# Patient Record
Sex: Female | Born: 1955 | Race: White | Hispanic: No | Marital: Married | Smoking: Former smoker
Health system: Southern US, Community
[De-identification: ages and names within clinical notes are randomized; demographics above are authoritative.]

## PROBLEM LIST (undated history)

## (undated) DIAGNOSIS — R3989 Other symptoms and signs involving the genitourinary system: Secondary | ICD-10-CM

## (undated) DIAGNOSIS — D0512 Intraductal carcinoma in situ of left breast: Secondary | ICD-10-CM

## (undated) DIAGNOSIS — Z923 Personal history of irradiation: Secondary | ICD-10-CM

## (undated) DIAGNOSIS — Z9989 Dependence on other enabling machines and devices: Secondary | ICD-10-CM

## (undated) DIAGNOSIS — G4733 Obstructive sleep apnea (adult) (pediatric): Secondary | ICD-10-CM

## (undated) DIAGNOSIS — Z973 Presence of spectacles and contact lenses: Secondary | ICD-10-CM

## (undated) DIAGNOSIS — R000 Tachycardia, unspecified: Secondary | ICD-10-CM

## (undated) DIAGNOSIS — G1221 Amyotrophic lateral sclerosis: Secondary | ICD-10-CM

## (undated) DIAGNOSIS — N201 Calculus of ureter: Secondary | ICD-10-CM

## (undated) DIAGNOSIS — Z87440 Personal history of urinary (tract) infections: Secondary | ICD-10-CM

## (undated) DIAGNOSIS — N200 Calculus of kidney: Secondary | ICD-10-CM

## (undated) HISTORY — PX: BREAST LUMPECTOMY: SHX2

---

## 2001-07-18 HISTORY — PX: LAMINECTOMY: SHX219

## 2014-07-18 DIAGNOSIS — Z923 Personal history of irradiation: Secondary | ICD-10-CM

## 2014-07-18 HISTORY — DX: Personal history of irradiation: Z92.3

## 2016-08-18 HISTORY — PX: PORTA CATH INSERTION: CATH118285

## 2017-03-06 ENCOUNTER — Emergency Department (HOSPITAL_COMMUNITY): Payer: BLUE CROSS/BLUE SHIELD

## 2017-03-06 ENCOUNTER — Observation Stay (HOSPITAL_COMMUNITY): Payer: BLUE CROSS/BLUE SHIELD

## 2017-03-06 ENCOUNTER — Inpatient Hospital Stay (HOSPITAL_COMMUNITY)
Admission: EM | Admit: 2017-03-06 | Discharge: 2017-03-08 | DRG: 872 | Disposition: A | Discharge status: Home / Self Care | Payer: BLUE CROSS/BLUE SHIELD | Attending: Family Medicine | Admitting: Family Medicine

## 2017-03-06 ENCOUNTER — Encounter (HOSPITAL_COMMUNITY)
Admission: EM | Disposition: A | Discharge status: Home / Self Care | Payer: Self-pay | Source: Home / Self Care | Attending: Internal Medicine

## 2017-03-06 ENCOUNTER — Observation Stay (HOSPITAL_COMMUNITY): Payer: BLUE CROSS/BLUE SHIELD | Admitting: Anesthesiology

## 2017-03-06 ENCOUNTER — Encounter (HOSPITAL_COMMUNITY): Payer: Self-pay | Admitting: Emergency Medicine

## 2017-03-06 DIAGNOSIS — E872 Acidosis: Secondary | ICD-10-CM | POA: Diagnosis present

## 2017-03-06 DIAGNOSIS — F329 Major depressive disorder, single episode, unspecified: Secondary | ICD-10-CM | POA: Diagnosis present

## 2017-03-06 DIAGNOSIS — Z87440 Personal history of urinary (tract) infections: Secondary | ICD-10-CM

## 2017-03-06 DIAGNOSIS — Z87442 Personal history of urinary calculi: Secondary | ICD-10-CM

## 2017-03-06 DIAGNOSIS — G1221 Amyotrophic lateral sclerosis: Secondary | ICD-10-CM | POA: Diagnosis present

## 2017-03-06 DIAGNOSIS — N10 Acute tubulo-interstitial nephritis: Secondary | ICD-10-CM | POA: Diagnosis not present

## 2017-03-06 DIAGNOSIS — R000 Tachycardia, unspecified: Secondary | ICD-10-CM | POA: Diagnosis present

## 2017-03-06 DIAGNOSIS — Z87448 Personal history of other diseases of urinary system: Secondary | ICD-10-CM

## 2017-03-06 DIAGNOSIS — R9431 Abnormal electrocardiogram [ECG] [EKG]: Secondary | ICD-10-CM | POA: Diagnosis not present

## 2017-03-06 DIAGNOSIS — Z853 Personal history of malignant neoplasm of breast: Secondary | ICD-10-CM

## 2017-03-06 DIAGNOSIS — A419 Sepsis, unspecified organism: Principal | ICD-10-CM | POA: Diagnosis present

## 2017-03-06 DIAGNOSIS — R197 Diarrhea, unspecified: Secondary | ICD-10-CM | POA: Diagnosis not present

## 2017-03-06 DIAGNOSIS — Z923 Personal history of irradiation: Secondary | ICD-10-CM

## 2017-03-06 DIAGNOSIS — Z833 Family history of diabetes mellitus: Secondary | ICD-10-CM

## 2017-03-06 DIAGNOSIS — N1330 Unspecified hydronephrosis: Secondary | ICD-10-CM | POA: Diagnosis not present

## 2017-03-06 DIAGNOSIS — N132 Hydronephrosis with renal and ureteral calculous obstruction: Secondary | ICD-10-CM | POA: Diagnosis not present

## 2017-03-06 DIAGNOSIS — N201 Calculus of ureter: Secondary | ICD-10-CM

## 2017-03-06 DIAGNOSIS — N136 Pyonephrosis: Secondary | ICD-10-CM | POA: Diagnosis present

## 2017-03-06 DIAGNOSIS — N390 Urinary tract infection, site not specified: Secondary | ICD-10-CM | POA: Diagnosis not present

## 2017-03-06 DIAGNOSIS — N12 Tubulo-interstitial nephritis, not specified as acute or chronic: Secondary | ICD-10-CM | POA: Diagnosis not present

## 2017-03-06 DIAGNOSIS — Z8249 Family history of ischemic heart disease and other diseases of the circulatory system: Secondary | ICD-10-CM

## 2017-03-06 DIAGNOSIS — N202 Calculus of kidney with calculus of ureter: Secondary | ICD-10-CM | POA: Diagnosis present

## 2017-03-06 DIAGNOSIS — N200 Calculus of kidney: Secondary | ICD-10-CM | POA: Diagnosis not present

## 2017-03-06 HISTORY — DX: Personal history of urinary (tract) infections: Z87.440

## 2017-03-06 HISTORY — DX: Amyotrophic lateral sclerosis: G12.21

## 2017-03-06 HISTORY — PX: CYSTOSCOPY WITH STENT PLACEMENT: SHX5790

## 2017-03-06 HISTORY — DX: Personal history of other diseases of urinary system: Z87.448

## 2017-03-06 LAB — CBC
HCT: 39 % (ref 36.0–46.0)
Hemoglobin: 13 g/dL (ref 12.0–15.0)
MCH: 29.5 pg (ref 26.0–34.0)
MCHC: 33.3 g/dL (ref 30.0–36.0)
MCV: 88.4 fL (ref 78.0–100.0)
PLATELETS: 160 10*3/uL (ref 150–400)
RBC: 4.41 MIL/uL (ref 3.87–5.11)
RDW: 14 % (ref 11.5–15.5)
WBC: 17.1 10*3/uL — AB (ref 4.0–10.5)

## 2017-03-06 LAB — URINALYSIS, ROUTINE W REFLEX MICROSCOPIC
Bilirubin Urine: NEGATIVE
GLUCOSE, UA: NEGATIVE mg/dL
KETONES UR: 80 mg/dL — AB
Nitrite: NEGATIVE
PH: 6 (ref 5.0–8.0)
Protein, ur: 100 mg/dL — AB
SPECIFIC GRAVITY, URINE: 1.015 (ref 1.005–1.030)

## 2017-03-06 LAB — CBC WITH DIFFERENTIAL/PLATELET
Basophils Absolute: 0 10*3/uL (ref 0.0–0.1)
Basophils Relative: 0 %
EOS ABS: 0 10*3/uL (ref 0.0–0.7)
EOS PCT: 0 %
HCT: 39.3 % (ref 36.0–46.0)
Hemoglobin: 13.3 g/dL (ref 12.0–15.0)
LYMPHS ABS: 0.6 10*3/uL — AB (ref 0.7–4.0)
LYMPHS PCT: 3 %
MCH: 29.5 pg (ref 26.0–34.0)
MCHC: 33.8 g/dL (ref 30.0–36.0)
MCV: 87.1 fL (ref 78.0–100.0)
MONO ABS: 1 10*3/uL (ref 0.1–1.0)
MONOS PCT: 6 %
Neutro Abs: 16.3 10*3/uL — ABNORMAL HIGH (ref 1.7–7.7)
Neutrophils Relative %: 91 %
PLATELETS: 163 10*3/uL (ref 150–400)
RBC: 4.51 MIL/uL (ref 3.87–5.11)
RDW: 14 % (ref 11.5–15.5)
WBC: 17.9 10*3/uL — AB (ref 4.0–10.5)

## 2017-03-06 LAB — CREATININE, SERUM
CREATININE: 0.52 mg/dL (ref 0.44–1.00)
GFR calc non Af Amer: >60 mL/min (ref >60)

## 2017-03-06 LAB — LACTIC ACID, PLASMA
LACTIC ACID, VENOUS: 2.4 mmol/L — AB (ref 0.5–1.9)
Lactic Acid, Venous: 3.1 mmol/L (ref 0.5–1.9)

## 2017-03-06 LAB — COMPREHENSIVE METABOLIC PANEL
ALBUMIN: 3.7 g/dL (ref 3.5–5.0)
ALT: 64 U/L — AB (ref 14–54)
AST: 49 U/L — AB (ref 15–41)
Alkaline Phosphatase: 71 U/L (ref 38–126)
Anion gap: 9 (ref 5–15)
BUN: 9 mg/dL (ref 6–20)
CHLORIDE: 104 mmol/L (ref 101–111)
CO2: 24 mmol/L (ref 22–32)
CREATININE: 0.52 mg/dL (ref 0.44–1.00)
Calcium: 9.4 mg/dL (ref 8.9–10.3)
GFR calc Af Amer: >60 mL/min (ref >60)
GLUCOSE: 91 mg/dL (ref 65–99)
Potassium: 3.6 mmol/L (ref 3.5–5.1)
Sodium: 137 mmol/L (ref 135–145)
Total Bilirubin: 0.8 mg/dL (ref 0.3–1.2)
Total Protein: 7.1 g/dL (ref 6.5–8.1)

## 2017-03-06 LAB — I-STAT CG4 LACTIC ACID, ED
LACTIC ACID, VENOUS: 1.12 mmol/L (ref 0.5–1.9)
Lactic Acid, Venous: 0.9 mmol/L (ref 0.5–1.9)

## 2017-03-06 LAB — PROCALCITONIN: Procalcitonin: 0.23 ng/mL

## 2017-03-06 LAB — TSH: TSH: 0.224 u[IU]/mL — AB (ref 0.350–4.500)

## 2017-03-06 SURGERY — CYSTOSCOPY, WITH STENT INSERTION
Anesthesia: General | Site: Urethra | Laterality: Right

## 2017-03-06 MED ORDER — DEXAMETHASONE SODIUM PHOSPHATE 10 MG/ML IJ SOLN
INTRAMUSCULAR | Status: AC
  Filled 2017-03-06: qty 1

## 2017-03-06 MED ORDER — ONDANSETRON HCL 4 MG/2ML IJ SOLN: 4.0000 mg | Freq: Once | INTRAMUSCULAR | Status: DC | PRN

## 2017-03-06 MED ORDER — IOHEXOL 300 MG/ML  SOLN
INTRAMUSCULAR | Status: DC | PRN
  Administered 2017-03-06: 8 mL via INTRAVENOUS

## 2017-03-06 MED ORDER — SUCCINYLCHOLINE CHLORIDE 200 MG/10ML IV SOSY
PREFILLED_SYRINGE | INTRAVENOUS | Status: AC
  Filled 2017-03-06: qty 10

## 2017-03-06 MED ORDER — MEPERIDINE HCL 50 MG/ML IJ SOLN: 6.2500 mg | INTRAMUSCULAR | Status: DC | PRN

## 2017-03-06 MED ORDER — MIDAZOLAM HCL 2 MG/2ML IJ SOLN
INTRAMUSCULAR | Status: AC
  Filled 2017-03-06: qty 2

## 2017-03-06 MED ORDER — SODIUM CHLORIDE 0.9 % IV SOLN
INTRAVENOUS | Status: DC
  Administered 2017-03-06: 20:00:00 via INTRAVENOUS

## 2017-03-06 MED ORDER — LACTATED RINGERS IV SOLN: INTRAVENOUS | Status: DC | PRN

## 2017-03-06 MED ORDER — LIDOCAINE 2% (20 MG/ML) 5 ML SYRINGE
INTRAMUSCULAR | Status: AC
  Filled 2017-03-06: qty 5

## 2017-03-06 MED ORDER — FENTANYL CITRATE (PF) 100 MCG/2ML IJ SOLN: 25.0000 ug | INTRAMUSCULAR | Status: DC | PRN

## 2017-03-06 MED ORDER — STERILE WATER FOR IRRIGATION IR SOLN
Status: DC | PRN
  Administered 2017-03-06: 3000 mL

## 2017-03-06 MED ORDER — EDARAVONE 30 MG/100ML IV SOLN: 30.0000 mg | Freq: Every day | INTRAVENOUS | Status: DC

## 2017-03-06 MED ORDER — DEXTROSE 5 % IV SOLN
2.0000 g | Freq: Once | INTRAVENOUS | Status: AC
  Administered 2017-03-06: 2 g via INTRAVENOUS
  Filled 2017-03-06: qty 2

## 2017-03-06 MED ORDER — BISACODYL 10 MG RE SUPP
10.0000 mg | Freq: Every day | RECTAL | Status: DC | PRN
  Administered 2017-03-07: 10 mg via RECTAL
  Filled 2017-03-06: qty 1

## 2017-03-06 MED ORDER — SODIUM CHLORIDE 0.9 % IV BOLUS (SEPSIS)
500.0000 mL | Freq: Once | INTRAVENOUS | Status: AC
  Administered 2017-03-06: 500 mL via INTRAVENOUS

## 2017-03-06 MED ORDER — ACETAMINOPHEN 500 MG PO TABS
1000.0000 mg | ORAL_TABLET | Freq: Once | ORAL | Status: AC
  Administered 2017-03-06: 1000 mg via ORAL
  Filled 2017-03-06: qty 2

## 2017-03-06 MED ORDER — ACETAMINOPHEN 10 MG/ML IV SOLN
1000.0000 mg | Freq: Once | INTRAVENOUS | Status: AC
  Administered 2017-03-06: 1000 mg via INTRAVENOUS

## 2017-03-06 MED ORDER — DEXTROSE 5 % IV SOLN
1.0000 g | Freq: Once | INTRAVENOUS | Status: AC
  Administered 2017-03-06: 1 g via INTRAVENOUS
  Filled 2017-03-06: qty 10

## 2017-03-06 MED ORDER — PROPOFOL 10 MG/ML IV BOLUS
INTRAVENOUS | Status: AC
  Filled 2017-03-06: qty 20

## 2017-03-06 MED ORDER — ZOLPIDEM TARTRATE 5 MG PO TABS
5.0000 mg | ORAL_TABLET | Freq: Every evening | ORAL | Status: DC | PRN
  Filled 2017-03-06: qty 1

## 2017-03-06 MED ORDER — VENLAFAXINE HCL ER 150 MG PO CP24
150.0000 mg | ORAL_CAPSULE | Freq: Every day | ORAL | Status: DC
  Administered 2017-03-06 – 2017-03-08 (×3): 150 mg via ORAL
  Filled 2017-03-06 (×3): qty 1

## 2017-03-06 MED ORDER — SODIUM CHLORIDE 0.9 % IV BOLUS (SEPSIS)
1000.0000 mL | Freq: Once | INTRAVENOUS | Status: AC
  Administered 2017-03-06: 1000 mL via INTRAVENOUS

## 2017-03-06 MED ORDER — TAMOXIFEN CITRATE 10 MG PO TABS
20.0000 mg | ORAL_TABLET | Freq: Every day | ORAL | Status: DC
  Administered 2017-03-07: 20 mg via ORAL
  Filled 2017-03-06 (×3): qty 2

## 2017-03-06 MED ORDER — DEXAMETHASONE SODIUM PHOSPHATE 10 MG/ML IJ SOLN
INTRAMUSCULAR | Status: DC | PRN
  Administered 2017-03-06: 10 mg via INTRAVENOUS

## 2017-03-06 MED ORDER — HYDROMORPHONE HCL-NACL 0.5-0.9 MG/ML-% IV SOSY: 0.5000 mg | PREFILLED_SYRINGE | INTRAVENOUS | Status: DC | PRN

## 2017-03-06 MED ORDER — DEXTROSE IN LACTATED RINGERS 5 % IV SOLN
INTRAVENOUS | Status: DC
  Administered 2017-03-06: 17:00:00 via INTRAVENOUS

## 2017-03-06 MED ORDER — DOCUSATE SODIUM 100 MG PO CAPS
100.0000 mg | ORAL_CAPSULE | Freq: Two times a day (BID) | ORAL | Status: DC
  Administered 2017-03-06 – 2017-03-07 (×3): 100 mg via ORAL
  Filled 2017-03-06 (×3): qty 1

## 2017-03-06 MED ORDER — MIDAZOLAM HCL 5 MG/5ML IJ SOLN
INTRAMUSCULAR | Status: DC | PRN
  Administered 2017-03-06: 2 mg via INTRAVENOUS

## 2017-03-06 MED ORDER — PROPOFOL 10 MG/ML IV BOLUS
INTRAVENOUS | Status: DC | PRN
  Administered 2017-03-06: 150 mg via INTRAVENOUS

## 2017-03-06 MED ORDER — FENTANYL CITRATE (PF) 100 MCG/2ML IJ SOLN
INTRAMUSCULAR | Status: AC
  Administered 2017-03-06: 50 ug via INTRAVENOUS
  Filled 2017-03-06: qty 2

## 2017-03-06 MED ORDER — FENTANYL CITRATE (PF) 100 MCG/2ML IJ SOLN
100.0000 ug | Freq: Once | INTRAMUSCULAR | Status: AC
  Administered 2017-03-06 (×2): 50 ug via INTRAVENOUS

## 2017-03-06 MED ORDER — LIDOCAINE HCL (CARDIAC) 20 MG/ML IV SOLN
INTRAVENOUS | Status: DC | PRN
  Administered 2017-03-06: 100 mg via INTRAVENOUS

## 2017-03-06 MED ORDER — ONDANSETRON HCL 4 MG/2ML IJ SOLN
4.0000 mg | INTRAMUSCULAR | Status: DC | PRN
  Administered 2017-03-06: 4 mg via INTRAVENOUS

## 2017-03-06 MED ORDER — LACTATED RINGERS IV SOLN
INTRAVENOUS | Status: DC | PRN
  Administered 2017-03-06: 16:00:00 via INTRAVENOUS

## 2017-03-06 MED ORDER — ONDANSETRON HCL 4 MG/2ML IJ SOLN
4.0000 mg | Freq: Once | INTRAMUSCULAR | Status: AC
  Administered 2017-03-06: 4 mg via INTRAVENOUS
  Filled 2017-03-06: qty 2

## 2017-03-06 MED ORDER — ACETAMINOPHEN 325 MG PO TABS: 650.0000 mg | ORAL_TABLET | Freq: Four times a day (QID) | ORAL | Status: DC | PRN

## 2017-03-06 MED ORDER — DEXTROSE 5 % IV SOLN
2.0000 g | Freq: Two times a day (BID) | INTRAVENOUS | Status: DC
  Administered 2017-03-07 – 2017-03-08 (×3): 2 g via INTRAVENOUS
  Filled 2017-03-06 (×5): qty 2

## 2017-03-06 MED ORDER — ACETAMINOPHEN 10 MG/ML IV SOLN
INTRAVENOUS | Status: AC
  Filled 2017-03-06: qty 100

## 2017-03-06 MED ORDER — OXYCODONE HCL 5 MG PO TABS
5.0000 mg | ORAL_TABLET | ORAL | Status: DC | PRN
  Administered 2017-03-06: 5 mg via ORAL
  Filled 2017-03-06 (×2): qty 1

## 2017-03-06 MED ORDER — RILUZOLE 50 MG PO TABS
50.0000 mg | ORAL_TABLET | Freq: Two times a day (BID) | ORAL | Status: DC
  Administered 2017-03-06 – 2017-03-08 (×4): 50 mg via ORAL

## 2017-03-06 MED ORDER — POLYETHYLENE GLYCOL 3350 17 G PO PACK: 17.0000 g | PACK | Freq: Every day | ORAL | Status: DC | PRN

## 2017-03-06 MED ORDER — MORPHINE SULFATE (PF) 2 MG/ML IV SOLN
4.0000 mg | Freq: Once | INTRAVENOUS | Status: AC
  Administered 2017-03-06: 4 mg via INTRAVENOUS
  Filled 2017-03-06: qty 2

## 2017-03-06 MED ORDER — IOPAMIDOL (ISOVUE-300) INJECTION 61%
INTRAVENOUS | Status: AC
  Filled 2017-03-06: qty 100

## 2017-03-06 MED ORDER — DEXTROSE 5 % IV SOLN: 1.0000 g | INTRAVENOUS | Status: DC

## 2017-03-06 MED ORDER — ENOXAPARIN SODIUM 40 MG/0.4ML ~~LOC~~ SOLN
40.0000 mg | SUBCUTANEOUS | Status: DC
  Administered 2017-03-06 – 2017-03-07 (×2): 40 mg via SUBCUTANEOUS
  Filled 2017-03-06 (×2): qty 0.4

## 2017-03-06 MED ORDER — LIDOCAINE HCL 2 % EX GEL
CUTANEOUS | Status: AC
  Filled 2017-03-06: qty 5

## 2017-03-06 MED ORDER — SODIUM CHLORIDE 0.9 % IV SOLN
INTRAVENOUS | Status: DC
  Administered 2017-03-06: 15:00:00 via INTRAVENOUS

## 2017-03-06 MED ORDER — FENTANYL CITRATE (PF) 100 MCG/2ML IJ SOLN
INTRAMUSCULAR | Status: AC
  Filled 2017-03-06: qty 2

## 2017-03-06 MED ORDER — ACETAMINOPHEN 325 MG PO TABS
650.0000 mg | ORAL_TABLET | ORAL | Status: DC | PRN
  Administered 2017-03-06: 650 mg via ORAL
  Filled 2017-03-06: qty 2

## 2017-03-06 MED ORDER — SENNA 8.6 MG PO TABS
1.0000 | ORAL_TABLET | Freq: Two times a day (BID) | ORAL | Status: DC
  Administered 2017-03-06 – 2017-03-07 (×4): 8.6 mg via ORAL
  Filled 2017-03-06 (×4): qty 1

## 2017-03-06 MED ORDER — IOPAMIDOL (ISOVUE-300) INJECTION 61%
100.0000 mL | Freq: Once | INTRAVENOUS | Status: AC | PRN
  Administered 2017-03-06: 100 mL via INTRAVENOUS

## 2017-03-06 MED ORDER — OXYBUTYNIN CHLORIDE 5 MG PO TABS
5.0000 mg | ORAL_TABLET | Freq: Three times a day (TID) | ORAL | Status: DC | PRN
  Administered 2017-03-07 (×3): 5 mg via ORAL
  Filled 2017-03-06 (×3): qty 1

## 2017-03-06 MED ORDER — ACETAMINOPHEN 650 MG RE SUPP: 650.0000 mg | Freq: Four times a day (QID) | RECTAL | Status: DC | PRN

## 2017-03-06 MED ORDER — KETOROLAC TROMETHAMINE 15 MG/ML IJ SOLN
15.0000 mg | Freq: Once | INTRAMUSCULAR | Status: AC
  Administered 2017-03-06: 15 mg via INTRAVENOUS
  Filled 2017-03-06: qty 1

## 2017-03-06 MED ORDER — PHENYLEPHRINE 40 MCG/ML (10ML) SYRINGE FOR IV PUSH (FOR BLOOD PRESSURE SUPPORT)
PREFILLED_SYRINGE | INTRAVENOUS | Status: AC
  Filled 2017-03-06: qty 10

## 2017-03-06 MED ORDER — ONDANSETRON HCL 4 MG/2ML IJ SOLN
INTRAMUSCULAR | Status: AC
  Filled 2017-03-06: qty 2

## 2017-03-06 SURGICAL SUPPLY — 15 items
BAG URO CATCHER STRL LF (MISCELLANEOUS) ×2 IMPLANT
CATH INTERMIT  6FR 70CM (CATHETERS) IMPLANT
CLOTH BEACON ORANGE TIMEOUT ST (SAFETY) ×2 IMPLANT
COVER FOOT SWITCH UNIV DISP (DRAPES) IMPLANT
COVER SURGICAL LIGHT HANDLE (MISCELLANEOUS) IMPLANT
GLOVE BIOGEL M 8.0 STRL (GLOVE) ×2 IMPLANT
GOWN STRL REUS W/ TWL XL LVL3 (GOWN DISPOSABLE) ×1 IMPLANT
GOWN STRL REUS W/TWL LRG LVL3 (GOWN DISPOSABLE) ×2 IMPLANT
GOWN STRL REUS W/TWL XL LVL3 (GOWN DISPOSABLE) ×1
GUIDEWIRE ANG ZIPWIRE 038X150 (WIRE) IMPLANT
GUIDEWIRE STR DUAL SENSOR (WIRE) ×2 IMPLANT
MANIFOLD NEPTUNE II (INSTRUMENTS) ×2 IMPLANT
PACK CYSTO (CUSTOM PROCEDURE TRAY) ×2 IMPLANT
STENT URET 6FRX24 CONTOUR (STENTS) ×2 IMPLANT
TUBING CONNECTING 10 (TUBING) ×2 IMPLANT

## 2017-03-06 NOTE — Anesthesia Preprocedure Evaluation (Addendum)
Anesthesia Evaluation  Patient identified by MRN, date of birth, ID band Patient awake    Reviewed: Allergy & Precautions, NPO status , Patient's Chart, lab work & pertinent test results  Airway Mallampati: I  TM Distance: >3 FB Neck ROM: Full    Dental no notable dental hx.    Pulmonary neg pulmonary ROS,    Pulmonary exam normal breath sounds clear to auscultation       Cardiovascular negative cardio ROS Normal cardiovascular exam Rhythm:Regular Rate:Normal     Neuro/Psych  Neuromuscular disease negative neurological ROS  negative psych ROS   GI/Hepatic negative GI ROS, Neg liver ROS,   Endo/Other  negative endocrine ROS  Renal/GU Renal diseasenegative Renal ROS  negative genitourinary   Musculoskeletal negative musculoskeletal ROS (+)   Abdominal   Peds negative pediatric ROS (+)  Hematology negative hematology ROS (+)   Anesthesia Other Findings ALS  Reproductive/Obstetrics negative OB ROS                           Anesthesia Physical Anesthesia Plan  ASA: III and emergent  Anesthesia Plan: General   Post-op Pain Management:    Induction: Intravenous  PONV Risk Score and Plan: 2 and Ondansetron, Dexamethasone and Treatment may vary due to age or medical condition  Airway Management Planned: Oral ETT and LMA  Additional Equipment:   Intra-op Plan:   Post-operative Plan: Extubation in OR  Informed Consent:   Plan Discussed with:   Anesthesia Plan Comments: (  )       Anesthesia Quick Evaluation

## 2017-03-06 NOTE — ED Notes (Signed)
Patient transported to CT 

## 2017-03-06 NOTE — Progress Notes (Signed)
Pharmacy Antibiotic Note  Laurie Wise is a 61 y.o. female admitted on 03/06/2017 with right distal ureteral stone with UTI, underwent stent placement earlier today. Patient had taken Bactrim x 1 day PTA. Patient initially placed on Ceftriaxone on admission, now broadened to Cefepime with request to pharmacy to assist with dosing.   Plan: Cefepime 2g IV q12h. Monitor renal function, cultures, clinical course.   Height: 5\' 6"  (167.6 cm) Weight: 205 lb 7.5 oz (93.2 kg) IBW/kg (Calculated) : 59.3  Temp (24hrs), Avg:101.2 F (38.4 C), Min:99.5 F (37.5 C), Max:103.2 F (39.6 C)   Recent Labs Lab 03/06/17 0820 03/06/17 0833 03/06/17 1057 03/06/17 1327 03/06/17 1416  WBC 17.9*  --   --   --  17.1*  CREATININE 0.52  --   --  0.52  --   LATICACIDVEN  --  1.12 0.90  --   --     Estimated Creatinine Clearance: 85 mL/min (by C-G formula based on SCr of 0.52 mg/dL).    Allergies  Allergen Reactions  . Adhesive [Tape] Other (See Comments)    Blisters     Antimicrobials this admission: 8/20 >> Ceftriaxone x 1 8/20 >> Cefepime >>  Dose adjustments this admission: --  Microbiology results: 8/20 BCx: sent 8/20 UCx: sent    Thank you for allowing pharmacy to be a part of this patient's care.   Lindell Spar, PharmD, BCPS Pager: [phone] 03/06/2017 7:05 PM

## 2017-03-06 NOTE — Op Note (Signed)
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 She presents at this time for urgent cysto and J2 stent placement.  Findings: Retrograde ureteropyelogram on the right revealed a filling defect in the distal ureter consistent with her stone.  Proximal hydroureteronephrosis without further filling defects.  Bladder appeared normal.  Ureteral orifices normal.  Description of procedure: The patient was properly identified in the holding area.  The right side was appropriately marked.  She was taken to the operating room where general anesthetic was administered using the LMA.  She was placed in the dorsolithotomy position.  Genitalia and perineum were prepped and draped.  Timeout was then performed.  A 21 French panendoscope was advanced into her bladder.  Systematic inspection bladder carried out with normal findings of the urothelium and ureteral orifice ease.  A 6 French open-ended catheter was utilized to perform a retrograde ureteropyelogram on the right using Omnipaque.  This showed the above-mentioned findings.  Following the ureteropyelogram, a 0.038 inch sensor-tip guidewire was advanced through the open-ended catheter and using fluoroscopic guidance, advanced to the upper pole calyceal system.  Once this position was seen, the open-ended catheter was removed.  I then advanced a 6 [place] by 24 centimeter contour double-J stent with tether removed into the right ureter.  Following deployment by removing the guidewire, excellent proximal and distal curls were seen in the kidney and bladder, respectively.  At this  point, the bladder was drained.  The scope was removed.  The patient was then awakened and taken to the PACU in stable condition.  She tolerated the procedure well.

## 2017-03-06 NOTE — ED Notes (Signed)
Call report to [facility] @ [phone] @ [phone].

## 2017-03-06 NOTE — Consult Note (Signed)
Urology Consult   Physician requesting consult: A. Allen. MD  Reason for consult: Kidney stone, pyelo  History of Present Illness: Laurie Wise is a 61 y.o. female presenting to the Er this am with a 3 day h/o abd/flank pain. She developed a fever yesterday and presented to the nearby urgent care and was put on sulfa. Sx's became worse today, and she came to the [facility] ER where she was dx'ed with a 7 mm rt distal stone and UTI. GU consult requested.  Prior h/o nephrocalcinosis, but no stones passed recently. @-3 UTI's this year.    Past Medical History:  Diagnosis Date  . DCIS of the left breast dx' 2016   surg and xrt  . [facility] disease [facility])     Past Surgical History:  Procedure Laterality Date  . CESAREAN SECTION    . LAMINECTOMY     l4-l5  . PORTA CATH INSERTION     power     Current Hospital Medications: Scheduled Meds: . iopamidol       Continuous Infusions: PRN Meds:.  Allergies:  Allergies  Allergen Reactions  . Adhesive [Tape] Other (See Comments)    Blisters     No family history on file.  Social History:  has no tobacco, alcohol, and drug history on file.  ROS: A complete review of systems was performed.  All systems are negative except for pertinent findings as noted.  Physical Exam:  Vital signs in last 24 hours: Temp:  [99.5 F (37.5 C)] 99.5 F (37.5 C) (08/20 0718) Pulse Rate:  [123-131] 123 (08/20 1130) Resp:  [16-19] 19 (08/20 1130) BP: (140-151)/(82-88) 151/82 (08/20 0930) SpO2:  [94 %-97 %] 95 % (08/20 1130) Weight:  [90.7 kg (200 lb)] 90.7 kg (200 lb) (08/20 0718) General:  Alert and oriented, No acute distress HEENT: Normocephalic, atraumatic Neck: No JVD or lymphadenopathy Cardiovascular: Tachycardic. Lungs: Nml inspiratory/expiratory excursion Abdomen: Soft, obese. RLQ/R CVAT Extremities: No edema Neurologic: Grossly intact  Laboratory Data:   Recent Labs  03/06/17 0820  WBC 17.9*  HGB 13.3  HCT 39.3  PLT 163      Recent Labs  03/06/17 0820  NA 137  K 3.6  CL 104  GLUCOSE 91  BUN 9  CALCIUM 9.4  CREATININE 0.52     Results for orders placed or performed during the hospital encounter of 03/06/17 (from the past 24 hour(s))  Urinalysis, Routine w reflex microscopic     Status: Abnormal   Collection Time: 03/06/17  7:37 AM  Result Value Ref Range   Color, Urine YELLOW YELLOW   APPearance CLOUDY (A) CLEAR   Specific Gravity, Urine 1.015 1.005 - 1.030   pH 6.0 5.0 - 8.0   Glucose, UA NEGATIVE NEGATIVE mg/dL   Hgb urine dipstick MODERATE (A) NEGATIVE   Bilirubin Urine NEGATIVE NEGATIVE   Ketones, ur 80 (A) NEGATIVE mg/dL   Protein, ur 100 (A) NEGATIVE mg/dL   Nitrite NEGATIVE NEGATIVE   Leukocytes, UA LARGE (A) NEGATIVE   RBC / HPF TOO NUMEROUS TO COUNT 0 - 5 RBC/hpf   WBC, UA TOO NUMEROUS TO COUNT 0 - 5 WBC/hpf   Bacteria, UA RARE (A) NONE SEEN   Squamous Epithelial / LPF 6-30 (A) NONE SEEN   Non Squamous Epithelial 0-5 (A) NONE SEEN  Comprehensive metabolic panel     Status: Abnormal   Collection Time: 03/06/17  8:20 AM  Result Value Ref Range   Sodium 137 135 - 145 mmol/L   Potassium  3.6 3.5 - 5.1 mmol/L   Chloride 104 101 - 111 mmol/L   CO2 24 22 - 32 mmol/L   Glucose, Bld 91 65 - 99 mg/dL   BUN 9 6 - 20 mg/dL   Creatinine, Ser 0.52 0.44 - 1.00 mg/dL   Calcium 9.4 8.9 - 10.3 mg/dL   Total Protein 7.1 6.5 - 8.1 g/dL   Albumin 3.7 3.5 - 5.0 g/dL   AST 49 (H) 15 - 41 U/L   ALT 64 (H) 14 - 54 U/L   Alkaline Phosphatase 71 38 - 126 U/L   Total Bilirubin 0.8 0.3 - 1.2 mg/dL   GFR calc non Af Amer >60 >60 mL/min   GFR calc Af Amer >60 >60 mL/min   Anion gap 9 5 - 15  CBC with Differential     Status: Abnormal   Collection Time: 03/06/17  8:20 AM  Result Value Ref Range   WBC 17.9 (H) 4.0 - 10.5 K/uL   RBC 4.51 3.87 - 5.11 MIL/uL   Hemoglobin 13.3 12.0 - 15.0 g/dL   HCT 39.3 36.0 - 46.0 %   MCV 87.1 78.0 - 100.0 fL   MCH 29.5 26.0 - 34.0 pg   MCHC 33.8 30.0 - 36.0  g/dL   RDW 14.0 11.5 - 15.5 %   Platelets 163 150 - 400 K/uL   Neutrophils Relative % 91 %   Neutro Abs 16.3 (H) 1.7 - 7.7 K/uL   Lymphocytes Relative 3 %   Lymphs Abs 0.6 (L) 0.7 - 4.0 K/uL   Monocytes Relative 6 %   Monocytes Absolute 1.0 0.1 - 1.0 K/uL   Eosinophils Relative 0 %   Eosinophils Absolute 0.0 0.0 - 0.7 K/uL   Basophils Relative 0 %   Basophils Absolute 0.0 0.0 - 0.1 K/uL  I-Stat CG4 Lactic Acid, ED     Status: None   Collection Time: 03/06/17  8:33 AM  Result Value Ref Range   Lactic Acid, Venous 1.12 0.5 - 1.9 mmol/L  I-Stat CG4 Lactic Acid, ED     Status: None   Collection Time: 03/06/17 10:57 AM  Result Value Ref Range   Lactic Acid, Venous 0.90 0.5 - 1.9 mmol/L   No results found for this or any previous visit (from the past 240 hour(s)).  Renal Function:  Recent Labs  03/06/17 0820  CREATININE 0.52   Estimated Creatinine Clearance: 83.8 mL/min (by C-G formula based on SCr of 0.52 mg/dL).  Radiologic Imaging: Ct Abdomen Pelvis W Contrast  Result Date: 03/06/2017 CLINICAL DATA:  Abdominal and pelvic pain since Friday. Hematuria. Diarrhea. Pyelonephritis. EXAM: CT ABDOMEN AND PELVIS WITH CONTRAST TECHNIQUE: Multidetector CT imaging of the abdomen and pelvis was performed using the standard protocol following bolus administration of intravenous contrast. CONTRAST:  165mL ISOVUE-300 IOPAMIDOL (ISOVUE-300) INJECTION 61% COMPARISON:  None. FINDINGS: Lower chest: Subsegmental atelectasis at the lung bases. Mild cardiomegaly. Incompletely imaged central line. Hepatobiliary: Focal steatosis adjacent the falciform ligament. Normal gallbladder, without biliary ductal dilatation. Pancreas: Normal, without mass or ductal dilatation. Spleen: Normal in size, without focal abnormality. Adrenals/Urinary Tract: Normal adrenal glands. Bilateral renal collecting system calculi. Moderate right-sided hydroureteronephrosis with decreased excretion/ function involving the right  kidney. This continues to the level of a distal right ureteric 5x7 mm stone on image 73/series 2 and coronal image 63. Heterogeneous right renal enhancement is most apparent in the lower pole on image 29/ series 7. Normal urinary bladder. Stomach/Bowel: Normal stomach, without wall thickening. Normal colon and terminal  ileum. Normal small bowel. Vascular/Lymphatic: Aortic and branch vessel atherosclerosis. No abdominopelvic adenopathy. Reproductive: Normal uterus and adnexa. Other: No significant free fluid. Musculoskeletal: Lumbosacral spondylosis. IMPRESSION: 1. Moderate right-sided urinary tract obstruction secondary to a distal right ureteric stone. 2. Heterogeneous right renal enhancement, likely representing concurrent pyelonephritis. 3. Bilateral nephrolithiasis. 4.  Aortic Atherosclerosis (ICD10-I70.0). Electronically Signed   By: Abigail Miyamoto M.D.   On: 03/06/2017 10:42    I independently reviewed the above imaging studies.  Impression/Assessment:  Rt dital ureteral stone w/ UTI  Plan:  Urgent cysto, Rt J2 stent placement

## 2017-03-06 NOTE — Care Management Note (Signed)
Case Management Note  Patient Details  Name: Laurie Wise MRN: [id number] Date of Birth: [birth year]  Subjective/Objective:  61 y/o f admitted w/ureteral stone,UTI. From home. S/p cysto,double J stent placement.                  Action/Plan:d/c plan home.   Expected Discharge Date:   (unknown)               Expected Discharge Plan:  Home/Self Care  In-House Referral:     Discharge planning Services  CM Consult  Post Acute Care Choice:    Choice offered to:     DME Arranged:    DME Agency:     HH Arranged:    HH Agency:     Status of Service:  In process, will continue to follow  If discussed at Long Length of Stay Meetings, dates discussed:    Additional Comments:  Dessa Phi, RN 03/06/2017, 2:28 PM

## 2017-03-06 NOTE — ED Notes (Signed)
Bed: WA13 Expected date:  Expected time:  Means of arrival:  Comments: Triage 1 

## 2017-03-06 NOTE — ED Triage Notes (Signed)
Pt states she has had abdominal pain with dysuria x 3 days. Dx with UTI at [facility] yesterday and has taken two doses of antibiotics but is still febrile. Alert and oriented.

## 2017-03-06 NOTE — H&P (Signed)
Laurie Wise YTK:[id number] DOB: 1955-12-14 DOA: 03/06/2017     PCP: Patient, No Pcp Per   Outpatient Specialists: [facility]  Neurology Dr. Rolena Infante CNC Patient coming from:  home Lives  With family    Chief Complaint: abdominal pain and disuria  HPI: Laurie Wise is a 61 y.o. female with medical history significant of ALS, recurrent UTI, depression,     Presented with 3 day hx of dysuria and abdominal pain she presented to urgent care and was diagnosed with UTI start the Bactrim. She endorses some nausea and vomiting she has prior history of kidney stones and feels similar. She denies any syncope chest pain shortness of breath cough diarrhea. She continued to have fevers and chills and presented to emergency department.  In ER CT scan found 7 mm right distal stone with proximal hydroureteronephrosis. Her urine was significant for probable UTI and urology was consulted She was taken to or on 03/06/2017 and underwent cystoscopy with double J stent placement   Regarding pertinent Chronic problems: She has known history of ALS walks with a walker. She reports prior history of UTIs she have had at least 3 this year. History of left breast cancer status post resection and radiation therapy    IN ER:  Temp (24hrs), Avg:101.2 F (38.4 C), Min:99.5 F (37.5 C), Max:103.2 F (39.6 C)      on arrival  ED Triage Vitals [03/06/17 0718]  Enc Vitals Group     BP 140/88     Pulse Rate (!) 131     Resp 16     Temp 99.5 F (37.5 C)     Temp Source Oral     SpO2 97 %     Weight 200 lb (90.7 kg)     Height 5\' 6"  (1.676 m)     Head Circumference      Peak Flow      Pain Score 8     Pain Loc      Pain Edu?      Excl. in [place]?   Latest HR 125 BP 143/70 lactic acid 0.90 WBC 17.9 Hg 13.3 Na 137 K 3.6  CT abd Right urinary tract obstruction second distal right ureteral stone and  pyelonephritis  Following Medications were ordered in ER: Medications  iopamidol (ISOVUE-300) 61 %  injection (not administered)  Edaravone SOLN 30 mg ( Intravenous MAR Unhold 03/06/17 1649)  riluzole (RILUTEK) tablet 50 mg ( Oral MAR Unhold 03/06/17 1649)  tamoxifen (NOLVADEX) tablet 20 mg ( Oral MAR Unhold 03/06/17 1649)  enoxaparin (LOVENOX) injection 40 mg ( Subcutaneous MAR Unhold 03/06/17 1649)  dextrose 5 % in lactated ringers infusion ( Intravenous New Bag/Given 03/06/17 1658)  cefTRIAXone (ROCEPHIN) 1 g in dextrose 5 % 50 mL IVPB ( Intravenous MAR Unhold 03/06/17 1649)  acetaminophen (TYLENOL) tablet 650 mg (650 mg Oral Given 03/06/17 1659)  oxyCODONE (Oxy IR/ROXICODONE) immediate release tablet 5 mg (5 mg Oral Given 03/06/17 1823)  HYDROmorphone (DILAUDID) injection 0.5-1 mg ( Intravenous MAR Unhold 03/06/17 1649)  zolpidem (AMBIEN) tablet 5 mg ( Oral MAR Unhold 03/06/17 1649)  senna (SENOKOT) tablet 8.6 mg (8.6 mg Oral Given 03/06/17 1659)  ondansetron (ZOFRAN) injection 4 mg ( Intravenous MAR Unhold 03/06/17 1649)  venlafaxine XR (EFFEXOR-XR) 24 hr capsule 150 mg (150 mg Oral Given 03/06/17 1659)  oxybutynin (DITROPAN) tablet 5 mg (not administered)  acetaminophen (TYLENOL) tablet 1,000 mg (1,000 mg Oral Given 03/06/17 0900)  sodium chloride 0.9 % bolus 1,000 mL (0  mLs Intravenous Stopped 03/06/17 1046)  morphine 2 MG/ML injection 4 mg (4 mg Intravenous Given 03/06/17 0900)  ondansetron (ZOFRAN) injection 4 mg (4 mg Intravenous Given 03/06/17 0900)  cefTRIAXone (ROCEPHIN) 1 g in dextrose 5 % 50 mL IVPB (0 g Intravenous Stopped 03/06/17 1150)  iopamidol (ISOVUE-300) 61 % injection 100 mL (100 mLs Intravenous Contrast Given 03/06/17 1000)  ketorolac (TORADOL) 15 MG/ML injection 15 mg (15 mg Intravenous Given 03/06/17 1046)  fentaNYL (SUBLIMAZE) injection 100 mcg (50 mcg Intravenous Given 03/06/17 1501)  acetaminophen (OFIRMEV) 10 MG/ML IV (0 mg  Duplicate 6/78/93 8101)  acetaminophen (OFIRMEV) IV 1,000 mg (0 mg Intravenous Stopped 03/06/17 1600)     ER provider discussed case with:  Urology  Hospitalist was called for taking over patient care for sepsis secondary to pyelonephritis    Review of Systems:    Pertinent positives include: Fevers, chills, fatigue, nausea, vomiting, dysuria abdominal pain,  Constitutional:  No weight loss, night sweats,  weight loss  HEENT:  No headaches, Difficulty swallowing,Tooth/dental problems,Sore throat,  No sneezing, itching, ear ache, nasal congestion, post nasal drip,  Cardio-vascular:  No chest pain, Orthopnea, PND, anasarca, dizziness, palpitations.no Bilateral lower extremity swelling  GI:  No heartburn, indigestion, diarrhea, change in bowel habits, loss of appetite, melena, blood in stool, hematemesis Resp:  no shortness of breath at rest. No dyspnea on exertion, No excess mucus, no productive cough, No non-productive cough, No coughing up of blood.No change in color of mucus.No wheezing. Skin:  no rash or lesions. No jaundice GU:  no, change in color of urine, no urgency or frequency. No straining to urinate.  No flank pain.  Musculoskeletal:  No joint pain or no joint swelling. No decreased range of motion. No back pain.  Psych:  No change in mood or affect. No depression or anxiety. No memory loss.  Neuro: no localizing neurological complaints, no tingling, no weakness, no double vision, no gait abnormality, no slurred speech, no confusion  As per HPI otherwise 10 point review of systems negative.   Past Medical History: Past Medical History:  Diagnosis Date  . DCIS of the left breast dx' 2016   surg and xrt  . 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 CESAREAN SECTION    . CYSTOSCOPY WITH STENT PLACEMENT Right 03/06/2017   Procedure: CYSTOSCOPY WITH RIGHT STENT PLACEMENT;  Surgeon: Franchot Gallo, MD;  Location: [facility];  Service: Urology;  Laterality: Right;  . LAMINECTOMY     l4-l5  . PORTA CATH INSERTION     power     Social History:  Ambulatory cane   reports that she has never smoked. She has never used smokeless tobacco. She reports that she does not drink alcohol or use drugs.  Allergies:   Allergies  Allergen Reactions  . Adhesive [Tape] Other (See Comments)    Blisters        Family History:   Family History  Problem Relation Age of Onset  . Parkinson's disease Mother   . Renal cancer Father   . CAD Father   . Diabetes Other   . Stroke Neg Hx   . Clotting disorder Neg Hx     Medications: Prior to Admission medications   Medication Sig Start Date End Date Taking? Authorizing Provider  acetaminophen (TYLENOL) 500 MG tablet Take 1,000 mg by mouth every 6 (six) hours as needed for mild pain, moderate pain, fever or headache.   Yes [provider]  B Complex Vitamins (B COMPLEX 50) TABS Take 2 tablets by mouth daily.   Yes [provider]  Bioflavonoid Products (ESTER-C) TABS Take 2 tablets by mouth daily.   Yes [provider]  Cholecalciferol (VITAMIN D PO) Take 1 tablet by mouth daily.   Yes [provider]  Cyanocobalamin (VITAMIN B-12 SL) Place 1 tablet under the tongue daily.   Yes [provider]  Edaravone (RADICAVA) 30 MG/100ML SOLN Inject 30 mg into the vein See admin instructions. 10 days on and 18 days off [facility] = Mingo Amber, RN [phone] [place], [place]   Yes [provider]  Fish Oil-Cholecalciferol (OMEGA-3 + D PO) Take 5 mLs by mouth daily.   Yes [provider]  ibuprofen (ADVIL,MOTRIN) 200 MG tablet Take 600-800 mg by mouth every 6 (six) hours as needed for fever, headache, mild pain, moderate pain or cramping.   Yes [provider]  IRON PO Take 4 tablets by mouth daily.   Yes [provider]  Multiple Vitamin (MULTIVITAMIN WITH MINERALS) TABS tablet Take 2 tablets by mouth daily.   Yes [provider]  OVER THE COUNTER MEDICATION 1 scoop daily. *Trehalose Power* = sweetner   Yes [provider]   oxyCODONE-acetaminophen (PERCOCET/ROXICET) 5-325 MG tablet Take 1 tablet by mouth once.   Yes [provider]  Prenatal Vit w/Fe-Methylfol-FA (TL FOLATE PO) Take 1 tablet by mouth daily.   Yes [provider]  Probiotic CAPS Take 1 capsule by mouth daily.   Yes [provider]  RILUZOLE PO Take 2 tablets by mouth daily.   Yes [provider]  sulfamethoxazole-trimethoprim (BACTRIM DS,SEPTRA DS) 800-160 MG tablet Take 1 tablet by mouth 2 (two) times daily. Started 08/19 for 7 days   Yes [provider]  tamoxifen (NOLVADEX) 10 MG tablet Take 20 mg by mouth daily.   Yes [provider]  TURMERIC PO Take 2 tablets by mouth daily.   Yes [provider]  Venlafaxine HCl 150 MG TB24 Take 150 mg by mouth daily.   Yes [provider]  vitamin E 400 UNIT capsule Take 1,200 Units by mouth daily.   Yes [provider]    Physical Exam: Patient Vitals for the past 24 hrs:  BP Temp Temp src Pulse Resp SpO2 Height Weight  03/06/17 1820 - 99.6 F (37.6 C) Oral - - - - -  03/06/17 1642 130/78 (!) 102.5 F (39.2 C) - (!) 124 - 97 % - -  03/06/17 1630 139/87 - - (!) 125 16 98 % - -  03/06/17 1615 (!) 139/94 (!) 100.6 F (38.1 C) - (!) 130 20 98 % - -  03/06/17 1600 (!) 148/81 - - (!) 130 19 98 % - -  03/06/17 1548 (!) 159/98 (!) 103.2 F (39.6 C) - (!) 145 (!) 24 100 % - -  03/06/17 1505 - - - (!) 131 - 100 % - -  03/06/17 1500 - - - (!) 131 - 98 % - -  03/06/17 1455 - - - (!) 132 - 100 % - -  03/06/17 1450 - - - (!) 132 - 98 % - -  03/06/17 1445 - - - (!) 134 - 95 % - -  03/06/17 1443 - - - (!) 138 18 97 % - -  03/06/17 1430 - (!) 103.2 F (39.6 C) - - - - - -  03/06/17 1300 (!) 143/70 100.1 F (37.8 C) Oral Marland Kitchen)  125 16 95 % 5\' 6"  (1.676 m) 93.2 kg (205 lb 7.5 oz)  03/06/17 1208 (!) 165/96 - - (!) 132 16 92 % - -  03/06/17 1130 - - - (!) 123 19 95 % - -  03/06/17 0930 (!) 151/82 - - (!) 129 16 94 % - -  03/06/17  0718 140/88 99.5 F (37.5 C) Oral (!) 131 16 97 % 5\' 6"  (1.676 m) 90.7 kg (200 lb)    1. General:  in No Acute distress 2. Psychological: Alert and   Oriented 3. Head/ENT:     Dry Mucous Membranes                          Head Non traumatic, neck supple                            Poor Dentition 4. SKIN:  decreased Skin turgor,  Skin clean Dry and intact no rash 5. Heart: rapid Regular rate and rhythm no  Murmur, Rub or gallop 6. Lungs:  no wheezes or crackles   7. Abdomen: Soft,  non-tender, Non distended 8. Lower extremities: no clubbing, cyanosis, or edema 9. Neurologically Grossly intact, moving all 4 extremities equally   10. MSK: Normal range of motion   body mass index is 33.16 kg/m.  Labs on Admission:   Labs on Admission: I have personally reviewed following labs and imaging studies  CBC:  Recent Labs Lab 03/06/17 0820 03/06/17 1416  WBC 17.9* 17.1*  NEUTROABS 16.3*  --   HGB 13.3 13.0  HCT 39.3 39.0  MCV 87.1 88.4  PLT 163 017   Basic Metabolic Panel:  Recent Labs Lab 03/06/17 0820 03/06/17 1327  NA 137  --   K 3.6  --   CL 104  --   CO2 24  --   GLUCOSE 91  --   BUN 9  --   CREATININE 0.52 0.52  CALCIUM 9.4  --    GFR: Estimated Creatinine Clearance: 85 mL/min (by C-G formula based on SCr of 0.52 mg/dL). Liver Function Tests:  Recent Labs Lab 03/06/17 0820  AST 49*  ALT 64*  ALKPHOS 71  BILITOT 0.8  PROT 7.1  ALBUMIN 3.7   No results for input(s): LIPASE, AMYLASE in the last 168 hours. No results for input(s): AMMONIA in the last 168 hours. Coagulation Profile: No results for input(s): INR, PROTIME in the last 168 hours. Cardiac Enzymes: No results for input(s): CKTOTAL, CKMB, CKMBINDEX, TROPONINI in the last 168 hours. BNP (last 3 results) No results for input(s): PROBNP in the last 8760 hours. HbA1C: No results for input(s): HGBA1C in the last 72 hours. CBG: No results for input(s): GLUCAP in the last 168 hours. Lipid  Profile: No results for input(s): CHOL, HDL, LDLCALC, TRIG, CHOLHDL, LDLDIRECT in the last 72 hours. Thyroid Function Tests: No results for input(s): TSH, T4TOTAL, FREET4, T3FREE, THYROIDAB in the last 72 hours. Anemia Panel: No results for input(s): VITAMINB12, FOLATE, FERRITIN, TIBC, IRON, RETICCTPCT in the last 72 hours. Urine analysis:    Component Value Date/Time   COLORURINE YELLOW 03/06/2017 0737   APPEARANCEUR CLOUDY (A) 03/06/2017 0737   LABSPEC 1.015 03/06/2017 0737   PHURINE 6.0 03/06/2017 0737   GLUCOSEU NEGATIVE 03/06/2017 0737   HGBUR MODERATE (A) 03/06/2017 0737   BILIRUBINUR NEGATIVE 03/06/2017 0737   KETONESUR 80 (A) 03/06/2017 0737   PROTEINUR 100 (A) 03/06/2017  [place] 03/06/2017 0737   LEUKOCYTESUR LARGE (A) 03/06/2017 0737   Sepsis Labs: @LABRCNTIP (procalcitonin:4,lacticidven:4) )No results found for this or any previous visit (from the past 240 hour(s)).    UA Elevated by blood cell count as well as red blood cell count negative nitrites rare Bacteria make this equivocal  No results found for: HGBA1C  Estimated Creatinine Clearance: 85 mL/min (by C-G formula based on SCr of 0.52 mg/dL).  BNP (last 3 results) No results for input(s): PROBNP in the last 8760 hours.   ECG REPORT  Independently reviewed Rate:128  Rhythm: Sinus tachycardia ST&T Change: No acute ischemic changes   QTC 429  Filed Weights   03/06/17 0718 03/06/17 1300  Weight: 90.7 kg (200 lb) 93.2 kg (205 lb 7.5 oz)     Cultures: No results found for: SDES, SPECREQUEST, CULT, REPTSTATUS   Radiological Exams on Admission: Ct Abdomen Pelvis W Contrast  Result Date: 03/06/2017 CLINICAL DATA:  Abdominal and pelvic pain since Friday. Hematuria. Diarrhea. Pyelonephritis. EXAM: CT ABDOMEN AND PELVIS WITH CONTRAST TECHNIQUE: Multidetector CT imaging of the abdomen and pelvis was performed using the standard protocol following bolus administration of intravenous contrast.  CONTRAST:  152mL ISOVUE-300 IOPAMIDOL (ISOVUE-300) INJECTION 61% COMPARISON:  None. FINDINGS: Lower chest: Subsegmental atelectasis at the lung bases. Mild cardiomegaly. Incompletely imaged central line. Hepatobiliary: Focal steatosis adjacent the falciform ligament. Normal gallbladder, without biliary ductal dilatation. Pancreas: Normal, without mass or ductal dilatation. Spleen: Normal in size, without focal abnormality. Adrenals/Urinary Tract: Normal adrenal glands. Bilateral renal collecting system calculi. Moderate right-sided hydroureteronephrosis with decreased excretion/ function involving the right kidney. This continues to the level of a distal right ureteric 5x7 mm stone on image 73/series 2 and coronal image 63. Heterogeneous right renal enhancement is most apparent in the lower pole on image 29/ series 7. Normal urinary bladder. Stomach/Bowel: Normal stomach, without wall thickening. Normal colon and terminal ileum. Normal small bowel. Vascular/Lymphatic: Aortic and branch vessel atherosclerosis. No abdominopelvic adenopathy. Reproductive: Normal uterus and adnexa. Other: No significant free fluid. Musculoskeletal: Lumbosacral spondylosis. IMPRESSION: 1. Moderate right-sided urinary tract obstruction secondary to a distal right ureteric stone. 2. Heterogeneous right renal enhancement, likely representing concurrent pyelonephritis. 3. Bilateral nephrolithiasis. 4.  Aortic Atherosclerosis (ICD10-I70.0). Electronically Signed   By: Abigail Miyamoto M.D.   On: 03/06/2017 10:42   Dg C-arm 1-60 Min-no Report  Result Date: 03/06/2017 Fluoroscopy was utilized by the requesting physician.  No radiographic interpretation.    Chart has been reviewed    Assessment/Plan  61 y.o. female with medical history significant of ALS, recurrent UTI, depression,  Admitted for sepsis likely secondary to pyelonephritis  Present on Admission: . Pyonephrosis - given recurrent UTIs for now treated for cefepime until  urine cultures are available. . Sepsis ([place]) - obtain serial lactic acid blood and urine cultures, continue to rehydrate but cultures unfortunately will be obtained after antibiotic administration . ALS (amyotrophic lateral sclerosis) (HCC) supportive management currently minimally symptomatic continue home medications Tachycardia - possibly secondary to sepsis, we will also check TSH rehydrate and follow  Other plan as per orders.  DVT prophylaxis:  SCD   Code Status:  FULL CODE   as per patient    Family Communication:   Family  at  Bedside  plan of care was discussed with   Husband,  Disposition Plan:      To home once workup is complete and patient is stable  Consults called: Urology aware  Admission status:  inpatient     Level of care  tele      I have spent a total of 65 min on this admission  extra time was spent to discuss case with consultants  Phillips 03/06/2017, 7:53 PM    [facility]  Pager [phone]   after 2 AM please page floor coverage PA If 7AM-7PM, please contact the day team taking care of the patient  [email or web address]  Password [email or web address]

## 2017-03-06 NOTE — Progress Notes (Addendum)
CRITICAL VALUE ALERT  Critical Value:  Lactic acid 3.1  Date & Time Notied:  03/06/17, 2337  Provider Notified: Lamar Blinks, NP  Orders Received/Actions taken: 552ml NS bolus, recheck lab as ordered

## 2017-03-06 NOTE — Anesthesia Procedure Notes (Signed)
Procedure Name: LMA Insertion Date/Time: 03/06/2017 3:20 PM Performed by: Glory Buff Pre-anesthesia Checklist: Patient identified, Emergency Drugs available, Suction available and Patient being monitored Patient Re-evaluated:Patient Re-evaluated prior to induction Oxygen Delivery Method: Circle system utilized Preoxygenation: Pre-oxygenation with 100% oxygen Induction Type: IV induction Ventilation: Mask ventilation without difficulty LMA: LMA inserted LMA Size: 4.0 Number of attempts: 1 Placement Confirmation: positive ETCO2 Tube secured with: Tape Dental Injury: Teeth and Oropharynx as per pre-operative assessment

## 2017-03-06 NOTE — ED Notes (Signed)
Pt's lab on lactic acid=1.12  Communication error in mini lab with machine crossing over information.

## 2017-03-06 NOTE — Anesthesia Postprocedure Evaluation (Signed)
Anesthesia Post Note  Patient: Azhane Eckart  Procedure(s) Performed: Procedure(s) (LRB): CYSTOSCOPY WITH RIGHT STENT PLACEMENT (Right)     Patient location during evaluation: PACU Anesthesia Type: General Level of consciousness: awake and alert Pain management: pain level controlled Vital Signs Assessment: post-procedure vital signs reviewed and stable Respiratory status: spontaneous breathing, nonlabored ventilation, respiratory function stable and patient connected to nasal cannula oxygen Cardiovascular status: blood pressure returned to baseline and stable Postop Assessment: no signs of nausea or vomiting Anesthetic complications: no    Last Vitals:  Vitals:   03/06/17 1630 03/06/17 1642  BP: 139/87 130/78  Pulse: (!) 125 (!) 124  Resp: 16   Temp:  (!) 39.2 C  SpO2: 98% 97%    Last Pain:  Vitals:   03/06/17 1630  TempSrc:   PainSc: 3                  Eliodoro Gullett

## 2017-03-06 NOTE — ED Notes (Signed)
Informed patient/family primary RN off floor and to utilize call bell if she needs anything

## 2017-03-06 NOTE — Transfer of Care (Signed)
Immediate Anesthesia Transfer of Care Note  Patient: Laurie Wise  Procedure(s) Performed: Procedure(s): CYSTOSCOPY WITH RIGHT STENT PLACEMENT (Right)  Patient Location: PACU  Anesthesia Type:General  Level of Consciousness: awake, alert  and oriented  Airway & Oxygen Therapy: Patient Spontanous Breathing and Patient connected to nasal cannula oxygen  Post-op Assessment: Report given to RN and Post -op Vital signs reviewed and stable  Post vital signs: Reviewed and stable  Last Vitals:  Vitals:   03/06/17 1500 03/06/17 1505  BP:    Pulse: (!) 131 (!) 131  Resp:    Temp:    SpO2: 98% 100%    Last Pain:  Vitals:   03/06/17 1505  TempSrc:   PainSc: 5       Patients Stated Pain Goal: 1 (75/17/00 1749)  Complications: No apparent anesthesia complications

## 2017-03-06 NOTE — ED Provider Notes (Signed)
[place] DEPT Provider Note   CSN: [id number] Arrival date & time: 03/06/17  0705     History   Chief Complaint Chief Complaint  Patient presents with  . Dysuria    HPI Laurie Wise is a 61 y.o. female.  HPI  61 year old female past medical history significant for ALS presents to the emergency Department today with complaints of urinary symptoms, right flank pain, fever, chills, nausea, vomiting. Patient states that she was diagnosed with a UTI at urgent care yesterday. She was started on Bactrim. She has taken 2 pills of her Bactrim but states that this is not helping her symptoms. Patient states that she has had dysuria and flank pain for the past 3 days. Symptoms have worsened. She has not taken anything for the pain at home prior to arrival. She reports to 3 episodes of nonbloody nonbilious emesis prior to arrival. Reports fevers and chills at home. Patient does have history of nephrolithiasis. She denies any associated symptoms of ha, vision changes, lightheadedness, dizziness, congestion, neck pain, cp, sob, cough,  change in bowel habits, melena, hematochezia, lower extremity paresthesias.  Past Medical History:  Diagnosis Date  . DCIS of the left breast dx' 2016   surg and xrt  . [facility] disease [facility])     Patient Active Problem List   Diagnosis Date Noted  . Pyonephrosis 03/06/2017    Past Surgical History:  Procedure Laterality Date  . CESAREAN SECTION    . LAMINECTOMY     l4-l5  . PORTA CATH INSERTION     power    OB History    No data available       Home Medications    Prior to Admission medications   Medication Sig Start Date End Date Taking? Authorizing Provider  acetaminophen (TYLENOL) 500 MG tablet Take 1,000 mg by mouth every 6 (six) hours as needed for mild pain, moderate pain, fever or headache.   Yes [provider]  B Complex Vitamins (B COMPLEX 50) TABS Take 2 tablets by mouth daily.   Yes [provider]    Bioflavonoid Products (ESTER-C) TABS Take 2 tablets by mouth daily.   Yes [provider]  Cholecalciferol (VITAMIN D PO) Take 1 tablet by mouth daily.   Yes [provider]  Cyanocobalamin (VITAMIN B-12 SL) Place 1 tablet under the tongue daily.   Yes [provider]  Edaravone (RADICAVA) 30 MG/100ML SOLN Inject 30 mg into the vein See admin instructions. 10 days on and 18 days off [facility] = Mingo Amber, RN [phone] [place], [place]   Yes [provider]  Fish Oil-Cholecalciferol (OMEGA-3 + D PO) Take 5 mLs by mouth daily.   Yes [provider]  ibuprofen (ADVIL,MOTRIN) 200 MG tablet Take 600-800 mg by mouth every 6 (six) hours as needed for fever, headache, mild pain, moderate pain or cramping.   Yes [provider]  IRON PO Take 4 tablets by mouth daily.   Yes [provider]  Multiple Vitamin (MULTIVITAMIN WITH MINERALS) TABS tablet Take 2 tablets by mouth daily.   Yes [provider]  OVER THE COUNTER MEDICATION 1 scoop daily. *Trehalose Power* = sweetner   Yes [provider]  oxyCODONE-acetaminophen (PERCOCET/ROXICET) 5-325 MG tablet Take 1 tablet by mouth once.   Yes [provider]  Prenatal Vit w/Fe-Methylfol-FA (TL FOLATE PO) Take 1 tablet by mouth daily.   Yes [provider]  Probiotic CAPS Take 1 capsule by mouth daily.   Yes [provider]  RILUZOLE PO Take 2 tablets by mouth daily.   Yes [provider]  sulfamethoxazole-trimethoprim (BACTRIM DS,SEPTRA DS) 800-160 MG tablet Take 1 tablet by mouth 2 (two) times daily. Started 08/19 for 7 days   Yes [provider]  tamoxifen (NOLVADEX) 10 MG tablet Take 20 mg by mouth daily.   Yes [provider]  TURMERIC PO Take 2 tablets by mouth daily.   Yes [provider]  Venlafaxine HCl 150 MG TB24 Take 150 mg by mouth daily.   Yes [provider]  vitamin E 400 UNIT capsule Take  1,200 Units by mouth daily.   Yes [provider]    Family History History reviewed. No pertinent family history.  Social History Social History  Substance Use Topics  . Smoking status: Never Smoker  . Smokeless tobacco: Never Used  . Alcohol use No     Allergies   Adhesive [tape]   Review of Systems Review of Systems  Constitutional: Positive for chills and fever.  HENT: Negative for congestion.   Eyes: Negative for visual disturbance.  Respiratory: Negative for cough and shortness of breath.   Cardiovascular: Negative for chest pain.  Gastrointestinal: Positive for abdominal pain. Negative for blood in stool, diarrhea, nausea and vomiting.  Genitourinary: Positive for dysuria, flank pain, frequency and urgency. Negative for hematuria, vaginal bleeding and vaginal discharge.  Musculoskeletal: Negative for arthralgias and myalgias.  Skin: Negative for rash.  Neurological: Negative for dizziness, syncope, weakness, light-headedness, numbness and headaches.  Psychiatric/Behavioral: Negative for sleep disturbance. The patient is not nervous/anxious.      Physical Exam Updated Vital Signs BP (!) 143/70 (BP Location: Left Arm)   Pulse (!) 131   Temp (!) 103.2 F (39.6 C)   Resp 18   Ht 5\' 6"  (1.676 m)   Wt 93.2 kg (205 lb 7.5 oz)   SpO2 100%   BMI 33.16 kg/m   Physical Exam  Constitutional: She is oriented to person, place, and time. She appears well-developed and well-nourished.  Non-toxic appearance. No distress.  Patient appears in d discomfort due to pain  HENT:  Head: Normocephalic and atraumatic.  Nose: Nose normal.  Mouth/Throat: Oropharynx is clear and moist.  Eyes: Pupils are equal, round, and reactive to light. Conjunctivae are normal. Right eye exhibits no discharge. Left eye exhibits no discharge.  Neck: Normal range of motion. Neck supple.  Cardiovascular: Regular rhythm, normal heart sounds and intact distal pulses.  Exam reveals no gallop  and no friction rub.   No murmur heard. Tachycardia   Pulmonary/Chest: Effort normal and breath sounds normal. No respiratory distress. She has no wheezes. She has no rales. She exhibits no tenderness.  Abdominal: Soft. Bowel sounds are normal. There is no tenderness. There is CVA tenderness (right). There is no rebound, no guarding, no tenderness at McBurney's point and negative Murphy's sign.  Musculoskeletal: Normal range of motion. She exhibits no tenderness.  Lymphadenopathy:    She has no cervical adenopathy.  Neurological: She is alert and oriented to person, place, and time.  Skin: Skin is warm and dry. Capillary refill takes less than 2 seconds.  Psychiatric: Her behavior is normal. Judgment and thought content normal.  Nursing note and vitals reviewed.    ED Treatments / Results  Labs (all labs ordered are listed, but only abnormal results are displayed) Labs Reviewed  COMPREHENSIVE METABOLIC PANEL - Abnormal; Notable for the following:       Result Value   AST  49 (*)    ALT 64 (*)    All other components within normal limits  CBC WITH DIFFERENTIAL/PLATELET - Abnormal; Notable for the following:    WBC 17.9 (*)    Neutro Abs 16.3 (*)    Lymphs Abs 0.6 (*)    All other components within normal limits  URINALYSIS, ROUTINE W REFLEX MICROSCOPIC - Abnormal; Notable for the following:    APPearance CLOUDY (*)    Hgb urine dipstick MODERATE (*)    Ketones, ur 80 (*)    Protein, ur 100 (*)    Leukocytes, UA LARGE (*)    Bacteria, UA RARE (*)    Squamous Epithelial / LPF 6-30 (*)    Non Squamous Epithelial 0-5 (*)    All other components within normal limits  CBC - Abnormal; Notable for the following:    WBC 17.1 (*)    All other components within normal limits  URINE CULTURE  SURGICAL PCR SCREEN  CREATININE, SERUM  HIV ANTIBODY (ROUTINE TESTING)  I-STAT CG4 LACTIC ACID, ED  I-STAT CG4 LACTIC ACID, ED    EKG  EKG Interpretation  Date/Time:  Monday March 06 2017  09:30:06 EDT Ventricular Rate:  128 PR Interval:    QRS Duration: 90 QT Interval:  294 QTC Calculation: 429 R Axis:   58 Text Interpretation:  Sinus tachycardia Consider right atrial enlargement Borderline T abnormalities, inferior leads No prior ECG for comparison.  No STEMI Confirmed by Antony Blackbird [phone]) on 03/06/2017 9:35:19 AM       Radiology Ct Abdomen Pelvis W Contrast  Result Date: 03/06/2017 CLINICAL DATA:  Abdominal and pelvic pain since Friday. Hematuria. Diarrhea. Pyelonephritis. EXAM: CT ABDOMEN AND PELVIS WITH CONTRAST TECHNIQUE: Multidetector CT imaging of the abdomen and pelvis was performed using the standard protocol following bolus administration of intravenous contrast. CONTRAST:  120mL ISOVUE-300 IOPAMIDOL (ISOVUE-300) INJECTION 61% COMPARISON:  None. FINDINGS: Lower chest: Subsegmental atelectasis at the lung bases. Mild cardiomegaly. Incompletely imaged central line. Hepatobiliary: Focal steatosis adjacent the falciform ligament. Normal gallbladder, without biliary ductal dilatation. Pancreas: Normal, without mass or ductal dilatation. Spleen: Normal in size, without focal abnormality. Adrenals/Urinary Tract: Normal adrenal glands. Bilateral renal collecting system calculi. Moderate right-sided hydroureteronephrosis with decreased excretion/ function involving the right kidney. This continues to the level of a distal right ureteric 5x7 mm stone on image 73/series 2 and coronal image 63. Heterogeneous right renal enhancement is most apparent in the lower pole on image 29/ series 7. Normal urinary bladder. Stomach/Bowel: Normal stomach, without wall thickening. Normal colon and terminal ileum. Normal small bowel. Vascular/Lymphatic: Aortic and branch vessel atherosclerosis. No abdominopelvic adenopathy. Reproductive: Normal uterus and adnexa. Other: No significant free fluid. Musculoskeletal: Lumbosacral spondylosis. IMPRESSION: 1. Moderate right-sided urinary tract obstruction  secondary to a distal right ureteric stone. 2. Heterogeneous right renal enhancement, likely representing concurrent pyelonephritis. 3. Bilateral nephrolithiasis. 4.  Aortic Atherosclerosis (ICD10-I70.0). Electronically Signed   By: Abigail Miyamoto M.D.   On: 03/06/2017 10:42    Procedures Procedures (including critical care time)  Medications Ordered in ED Medications  iopamidol (ISOVUE-300) 61 % injection (not administered)  Edaravone SOLN 30 mg ( Intravenous Automatically Held 03/19/17 1000)  riluzole (RILUTEK) tablet 50 mg ( Oral Automatically Held 03/14/17 2200)  tamoxifen (NOLVADEX) tablet 20 mg ( Oral Automatically Held 03/14/17 1000)  enoxaparin (LOVENOX) injection 40 mg ( Subcutaneous Automatically Held 03/14/17 2200)  dextrose 5 % in lactated ringers infusion (not administered)  cefTRIAXone (ROCEPHIN) 1 g in dextrose 5 % 50  mL IVPB ( Intravenous Automatically Held 03/15/17 1000)  acetaminophen (TYLENOL) tablet 650 mg ( Oral MAR Hold 03/06/17 1431)  oxyCODONE (Oxy IR/ROXICODONE) immediate release tablet 5 mg ( Oral MAR Hold 03/06/17 1431)  HYDROmorphone (DILAUDID) injection 0.5-1 mg ( Intravenous MAR Hold 03/06/17 1431)  zolpidem (AMBIEN) tablet 5 mg ( Oral MAR Hold 03/06/17 1431)  senna (SENOKOT) tablet 8.6 mg ( Oral Automatically Held 03/14/17 2200)  ondansetron (ZOFRAN) injection 4 mg (4 mg Intravenous Given 03/06/17 1518)  venlafaxine XR (EFFEXOR-XR) 24 hr capsule 150 mg ( Oral Automatically Held 03/14/17 1000)  0.9 %  sodium chloride infusion ( Intravenous New Bag/Given 03/06/17 1445)  acetaminophen (TYLENOL) tablet 1,000 mg (1,000 mg Oral Given 03/06/17 0900)  sodium chloride 0.9 % bolus 1,000 mL (0 mLs Intravenous Stopped 03/06/17 1046)  morphine 2 MG/ML injection 4 mg (4 mg Intravenous Given 03/06/17 0900)  ondansetron (ZOFRAN) injection 4 mg (4 mg Intravenous Given 03/06/17 0900)  cefTRIAXone (ROCEPHIN) 1 g in dextrose 5 % 50 mL IVPB (0 g Intravenous Stopped 03/06/17 1150)  iopamidol  (ISOVUE-300) 61 % injection 100 mL (100 mLs Intravenous Contrast Given 03/06/17 1000)  ketorolac (TORADOL) 15 MG/ML injection 15 mg (15 mg Intravenous Given 03/06/17 1046)  fentaNYL (SUBLIMAZE) injection 100 mcg (50 mcg Intravenous Given 03/06/17 1501)     Initial Impression / Assessment and Plan / ED Course  I have reviewed the triage vital signs and the nursing notes.  Pertinent labs & imaging results that were available during my care of the patient were reviewed by me and considered in my medical decision making (see chart for details).      patient presents to the emergency Department today with complaints of urinary symptoms, right flank pain, fever, chills. Patient seen in urgent care and diagnosed with UTI yesterday. She has taken 2 doses of her Bactrim at home with little relief. States the pain and fever has persisted. On exam patient does appear uncomfortable due to pain.   Patient is tachycardic in the 130s to the ED. Her temperature is 101.2. No hypoxia or tachypnea noted. Pressures are normal.   No focal abdominal tenderness to palpation. Patient does haveright-sided CVA tenderness.   Patient with leukocytosis 17,000. Creatinine is at baseline. Mild elevation of patient's LFTs This is nonspecific. UA does show signs of infection. Urine culture pending. Given patient's tachycardia, leukocytosis, and fever last gas that was obtained. Normal lactic acid. Patient given IV Rocephin for suspected pyelonephritis. Fluid bolus given. Pressures are normal. Does not meet sepsis criteria.   CT was obtained to rule out any obstructing stones given patient's history of kidney stones. CTA returned that shows right-sided obstructing stone with associated pyelonephritis. Pain is managed in the ED.  Patient remains tachycardic.  Spoke with Dr. Eulogio Ditch with urology who recommends patient to remain nothing by mouth and will take patient to the OR for surgical intervention.   Patient remains  hemodynamically stable.Marland Kitchen She was updated on plan of care. Patient discussed with my attending who is agreeable to above plan.  Final Clinical Impressions(s) / ED Diagnoses   Final diagnoses:  None    New Prescriptions Current Discharge Medication List       Aaron Edelman 03/06/17 1540    Lacretia Leigh, MD 03/07/17 [phone]

## 2017-03-06 NOTE — Progress Notes (Addendum)
CRITICAL VALUE ALERT  Critical Value:  Lactic acid 2.4   Date & Time Notied:  03/06/17  Provider Notified: Raliegh Ip Schorr at 2039  Orders Received/Actions taken: 558ml NS

## 2017-03-06 NOTE — ED Notes (Signed)
Urology at bedside.

## 2017-03-07 DIAGNOSIS — E872 Acidosis: Secondary | ICD-10-CM | POA: Diagnosis present

## 2017-03-07 DIAGNOSIS — N12 Tubulo-interstitial nephritis, not specified as acute or chronic: Secondary | ICD-10-CM | POA: Diagnosis not present

## 2017-03-07 DIAGNOSIS — A419 Sepsis, unspecified organism: Secondary | ICD-10-CM | POA: Diagnosis present

## 2017-03-07 DIAGNOSIS — Z87440 Personal history of urinary (tract) infections: Secondary | ICD-10-CM | POA: Diagnosis not present

## 2017-03-07 DIAGNOSIS — R000 Tachycardia, unspecified: Secondary | ICD-10-CM | POA: Diagnosis present

## 2017-03-07 DIAGNOSIS — Z833 Family history of diabetes mellitus: Secondary | ICD-10-CM | POA: Diagnosis not present

## 2017-03-07 DIAGNOSIS — N201 Calculus of ureter: Secondary | ICD-10-CM | POA: Diagnosis not present

## 2017-03-07 DIAGNOSIS — F329 Major depressive disorder, single episode, unspecified: Secondary | ICD-10-CM | POA: Diagnosis present

## 2017-03-07 DIAGNOSIS — N132 Hydronephrosis with renal and ureteral calculous obstruction: Secondary | ICD-10-CM | POA: Diagnosis not present

## 2017-03-07 DIAGNOSIS — N200 Calculus of kidney: Secondary | ICD-10-CM | POA: Diagnosis not present

## 2017-03-07 DIAGNOSIS — N10 Acute tubulo-interstitial nephritis: Secondary | ICD-10-CM | POA: Diagnosis not present

## 2017-03-07 DIAGNOSIS — Z923 Personal history of irradiation: Secondary | ICD-10-CM | POA: Diagnosis not present

## 2017-03-07 DIAGNOSIS — Z853 Personal history of malignant neoplasm of breast: Secondary | ICD-10-CM | POA: Diagnosis not present

## 2017-03-07 DIAGNOSIS — G1221 Amyotrophic lateral sclerosis: Secondary | ICD-10-CM | POA: Diagnosis present

## 2017-03-07 DIAGNOSIS — N136 Pyonephrosis: Secondary | ICD-10-CM

## 2017-03-07 DIAGNOSIS — Z87442 Personal history of urinary calculi: Secondary | ICD-10-CM | POA: Diagnosis not present

## 2017-03-07 DIAGNOSIS — Z8249 Family history of ischemic heart disease and other diseases of the circulatory system: Secondary | ICD-10-CM | POA: Diagnosis not present

## 2017-03-07 DIAGNOSIS — N202 Calculus of kidney with calculus of ureter: Secondary | ICD-10-CM | POA: Diagnosis present

## 2017-03-07 DIAGNOSIS — N390 Urinary tract infection, site not specified: Secondary | ICD-10-CM | POA: Diagnosis not present

## 2017-03-07 LAB — CBC
HEMATOCRIT: 34.4 % — AB (ref 36.0–46.0)
HEMOGLOBIN: 11.5 g/dL — AB (ref 12.0–15.0)
MCH: 29.9 pg (ref 26.0–34.0)
MCHC: 33.4 g/dL (ref 30.0–36.0)
MCV: 89.4 fL (ref 78.0–100.0)
Platelets: 148 10*3/uL — ABNORMAL LOW (ref 150–400)
RBC: 3.85 MIL/uL — ABNORMAL LOW (ref 3.87–5.11)
RDW: 14.1 % (ref 11.5–15.5)
WBC: 12.2 10*3/uL — ABNORMAL HIGH (ref 4.0–10.5)

## 2017-03-07 LAB — COMPREHENSIVE METABOLIC PANEL
ALBUMIN: 3 g/dL — AB (ref 3.5–5.0)
ALT: 47 U/L (ref 14–54)
ANION GAP: 6 (ref 5–15)
AST: 41 U/L (ref 15–41)
Alkaline Phosphatase: 60 U/L (ref 38–126)
BUN: 10 mg/dL (ref 6–20)
CHLORIDE: 112 mmol/L — AB (ref 101–111)
CO2: 22 mmol/L (ref 22–32)
Calcium: 8.7 mg/dL — ABNORMAL LOW (ref 8.9–10.3)
Creatinine, Ser: 0.44 mg/dL (ref 0.44–1.00)
GFR calc Af Amer: >60 mL/min (ref >60)
GFR calc non Af Amer: >60 mL/min (ref >60)
GLUCOSE: 200 mg/dL — AB (ref 65–99)
POTASSIUM: 3.9 mmol/L (ref 3.5–5.1)
SODIUM: 140 mmol/L (ref 135–145)
TOTAL PROTEIN: 6 g/dL — AB (ref 6.5–8.1)
Total Bilirubin: 0.2 mg/dL — ABNORMAL LOW (ref 0.3–1.2)

## 2017-03-07 LAB — PHOSPHORUS: PHOSPHORUS: 1.4 mg/dL — AB (ref 2.5–4.6)

## 2017-03-07 LAB — HIV ANTIBODY (ROUTINE TESTING W REFLEX): HIV Screen 4th Generation wRfx: NONREACTIVE

## 2017-03-07 LAB — MAGNESIUM: MAGNESIUM: 2 mg/dL (ref 1.7–2.4)

## 2017-03-07 LAB — URINE CULTURE

## 2017-03-07 LAB — T4, FREE: FREE T4: 1.3 ng/dL — AB (ref 0.61–1.12)

## 2017-03-07 LAB — LACTIC ACID, PLASMA
Lactic Acid, Venous: 2.7 mmol/L (ref 0.5–1.9)
Lactic Acid, Venous: 1.7 mmol/L (ref 0.5–1.9)

## 2017-03-07 MED ORDER — SODIUM CHLORIDE 0.9 % IV BOLUS (SEPSIS)
500.0000 mL | Freq: Once | INTRAVENOUS | Status: AC
  Administered 2017-03-07: 500 mL via INTRAVENOUS

## 2017-03-07 MED ORDER — SODIUM CHLORIDE 0.9 % IV SOLN
INTRAVENOUS | Status: AC
  Administered 2017-03-07: 09:00:00 via INTRAVENOUS

## 2017-03-07 MED ORDER — MENTHOL 3 MG MT LOZG
1.0000 | LOZENGE | OROMUCOSAL | Status: DC | PRN
  Filled 2017-03-07: qty 9

## 2017-03-07 MED ORDER — SODIUM CHLORIDE 0.9% FLUSH: 10.0000 mL | INTRAVENOUS | Status: DC | PRN

## 2017-03-07 MED ORDER — GUAIFENESIN ER 600 MG PO TB12
600.0000 mg | ORAL_TABLET | Freq: Two times a day (BID) | ORAL | Status: DC | PRN
  Administered 2017-03-07 (×2): 600 mg via ORAL
  Filled 2017-03-07 (×2): qty 1

## 2017-03-07 NOTE — Progress Notes (Addendum)
CRITICAL VALUE ALERT  Critical Value:  Lactic acid 2.7   Date & Time Notied:  03/07/17, time: 0238  Provider Notified: Lamar Blinks, NP  Orders Received/Actions taken: 574ml NS  Bolus, recheck lab

## 2017-03-07 NOTE — Progress Notes (Addendum)
PROGRESS NOTE    Laurie Wise  ZOX:[id number] DOB: Jan 25, 1956 DOA: 03/06/2017 PCP: Patient, No Pcp Per     Brief Narrative:  Laurie Wise is a 61 y.o. female with medical history significant of ALS, recurrent UTI, hx nephrolithiasis, hx left breast cancer s/p resection and radiation, depression. She presents with 3 day history of dysuria, abdominal pain. She presented to urgent care and was diagnosed with UTI. She was given bactrim. She continued to have fevers, chills and presented to ED. CT scan found 65mm right distal stone with proximal hydroureteronephrosis. Urology was consulted and she was taken to the OR and underwent cystoscopy with double J stent placement on 8/20.   Assessment & Plan:   Principal Problem:   Pyonephrosis Active Problems:   Sepsis ([place])   ALS (amyotrophic lateral sclerosis) ([facility])   Sepsis secondary to pyonephrosis -S/p cystoscopy with double J stent placement on 8/20 by Dr. Diona Fanti  -Blood culture pending  -Urine culture pending  -Continue cefepime -Improving leukocytosis   Lactic acidosis -IVF, trend   Low TSH -Check free T4, T3  ALS -Supportive care, walks with walker at baseline  -Continue home riluzole   Hx breast cancer s/p resection and radiation -Continue tamoxifen   Depression -Continue effexor     DVT prophylaxis: lovenox Code Status: full Family Communication: husband at bedside  Disposition Plan: pending improvement, culture results    Consultants:   Urology  Procedures:   S/p cystoscopy with double J stent placement on 8/2 by Dr. Diona Fanti   Antimicrobials:  Anti-infectives    Start     Dose/Rate Route Frequency Ordered Stop   03/07/17 1000  cefTRIAXone (ROCEPHIN) 1 g in dextrose 5 % 50 mL IVPB  Status:  Discontinued     1 g 100 mL/hr over 30 Minutes Intravenous Every 24 hours 03/06/17 1308 03/06/17 1859   03/07/17 0800  ceFEPIme (MAXIPIME) 2 g in dextrose 5 % 50 mL IVPB     2 g 100 mL/hr over 30 Minutes Intravenous  Every 12 hours 03/06/17 1901     03/06/17 1900  ceFEPIme (MAXIPIME) 2 g in dextrose 5 % 50 mL IVPB     2 g 100 mL/hr over 30 Minutes Intravenous  Once 03/06/17 1855 03/06/17 2014   03/06/17 0945  cefTRIAXone (ROCEPHIN) 1 g in dextrose 5 % 50 mL IVPB     1 g 100 mL/hr over 30 Minutes Intravenous  Once 03/06/17 0939 03/06/17 1150       Subjective: Doing well this morning, sitting in bed. States she has overall improved since being in the hospital, no complaints of pain.   Objective: Vitals:   03/06/17 1642 03/06/17 1820 03/06/17 2208 03/07/17 0432  BP: 130/78  133/87 (!) 158/88  Pulse: (!) 124  97 87  Resp:   18 18  Temp: (!) 102.5 F (39.2 C) 99.6 F (37.6 C) 97.6 F (36.4 C) 97.8 F (36.6 C)  TempSrc:  Oral Oral Oral  SpO2: 97%  97% 98%  Weight:      Height:        Intake/Output Summary (Last 24 hours) at 03/07/17 0856 Last data filed at 03/07/17 0823  Gross per 24 hour  Intake           2277.5 ml  Output             1950 ml  Net            327.5 ml   Filed Weights   03/06/17 8119  03/06/17 1300  Weight: 90.7 kg (200 lb) 93.2 kg (205 lb 7.5 oz)    Examination:  General exam: Appears calm and comfortable  Respiratory system: Clear to auscultation. Respiratory effort normal. Cardiovascular system: S1 & S2 heard, RRR. No JVD, murmurs, rubs, gallops or clicks. No pedal edema. Gastrointestinal system: Abdomen is nondistended, soft and nontender. No organomegaly or masses felt. Normal bowel sounds heard. Central nervous system: Alert and oriented. No focal neurological deficits. Extremities: Symmetric 5 x 5 power. Skin: No rashes, lesions or ulcers Psychiatry: Judgement and insight appear normal. Mood & affect appropriate.   Data Reviewed: I have personally reviewed following labs and imaging studies  CBC:  Recent Labs Lab 03/06/17 0820 03/06/17 1416 03/07/17 0155  WBC 17.9* 17.1* 12.2*  NEUTROABS 16.3*  --   --   HGB 13.3 13.0 11.5*  HCT 39.3 39.0 34.4*    MCV 87.1 88.4 89.4  PLT 163 160 169*   Basic Metabolic Panel:  Recent Labs Lab 03/06/17 0820 03/06/17 1327 03/07/17 0155  NA 137  --  140  K 3.6  --  3.9  CL 104  --  112*  CO2 24  --  22  GLUCOSE 91  --  200*  BUN 9  --  10  CREATININE 0.52 0.52 0.44  CALCIUM 9.4  --  8.7*  MG  --   --  2.0  PHOS  --   --  1.4*   GFR: Estimated Creatinine Clearance: 85 mL/min (by C-G formula based on SCr of 0.44 mg/dL). Liver Function Tests:  Recent Labs Lab 03/06/17 0820 03/07/17 0155  AST 49* 41  ALT 64* 47  ALKPHOS 71 60  BILITOT 0.8 0.2*  PROT 7.1 6.0*  ALBUMIN 3.7 3.0*   No results for input(s): LIPASE, AMYLASE in the last 168 hours. No results for input(s): AMMONIA in the last 168 hours. Coagulation Profile: No results for input(s): INR, PROTIME in the last 168 hours. Cardiac Enzymes: No results for input(s): CKTOTAL, CKMB, CKMBINDEX, TROPONINI in the last 168 hours. BNP (last 3 results) No results for input(s): PROBNP in the last 8760 hours. HbA1C: No results for input(s): HGBA1C in the last 72 hours. CBG: No results for input(s): GLUCAP in the last 168 hours. Lipid Profile: No results for input(s): CHOL, HDL, LDLCALC, TRIG, CHOLHDL, LDLDIRECT in the last 72 hours. Thyroid Function Tests:  Recent Labs  03/06/17 1942  TSH 0.224*   Anemia Panel: No results for input(s): VITAMINB12, FOLATE, FERRITIN, TIBC, IRON, RETICCTPCT in the last 72 hours. Sepsis Labs:  Recent Labs Lab 03/06/17 1057 03/06/17 1942 03/06/17 2237 03/07/17 0155  PROCALCITON  --  0.23  --   --   LATICACIDVEN 0.90 2.4* 3.1* 2.7*    No results found for this or any previous visit (from the past 240 hour(s)).     Radiology Studies: Ct Abdomen Pelvis W Contrast  Result Date: 03/06/2017 CLINICAL DATA:  Abdominal and pelvic pain since Friday. Hematuria. Diarrhea. Pyelonephritis. EXAM: CT ABDOMEN AND PELVIS WITH CONTRAST TECHNIQUE: Multidetector CT imaging of the abdomen and pelvis was  performed using the standard protocol following bolus administration of intravenous contrast. CONTRAST:  157mL ISOVUE-300 IOPAMIDOL (ISOVUE-300) INJECTION 61% COMPARISON:  None. FINDINGS: Lower chest: Subsegmental atelectasis at the lung bases. Mild cardiomegaly. Incompletely imaged central line. Hepatobiliary: Focal steatosis adjacent the falciform ligament. Normal gallbladder, without biliary ductal dilatation. Pancreas: Normal, without mass or ductal dilatation. Spleen: Normal in size, without focal abnormality. Adrenals/Urinary Tract: Normal adrenal glands. Bilateral renal  collecting system calculi. Moderate right-sided hydroureteronephrosis with decreased excretion/ function involving the right kidney. This continues to the level of a distal right ureteric 5x7 mm stone on image 73/series 2 and coronal image 63. Heterogeneous right renal enhancement is most apparent in the lower pole on image 29/ series 7. Normal urinary bladder. Stomach/Bowel: Normal stomach, without wall thickening. Normal colon and terminal ileum. Normal small bowel. Vascular/Lymphatic: Aortic and branch vessel atherosclerosis. No abdominopelvic adenopathy. Reproductive: Normal uterus and adnexa. Other: No significant free fluid. Musculoskeletal: Lumbosacral spondylosis. IMPRESSION: 1. Moderate right-sided urinary tract obstruction secondary to a distal right ureteric stone. 2. Heterogeneous right renal enhancement, likely representing concurrent pyelonephritis. 3. Bilateral nephrolithiasis. 4.  Aortic Atherosclerosis (ICD10-I70.0). Electronically Signed   By: Abigail Miyamoto M.D.   On: 03/06/2017 10:42   Dg C-arm 1-60 Min-no Report  Result Date: 03/06/2017 Fluoroscopy was utilized by the requesting physician.  No radiographic interpretation.      Scheduled Meds: . docusate sodium  100 mg Oral BID  . enoxaparin (LOVENOX) injection  40 mg Subcutaneous Q24H  . riluzole  50 mg Oral Q12H  . senna  1 tablet Oral BID  . tamoxifen  20 mg  Oral Daily  . venlafaxine XR  150 mg Oral Daily   Continuous Infusions: . sodium chloride    . ceFEPime (MAXIPIME) IV 2 g (03/07/17 0812)     LOS: 0 days    Time spent: 40 minutes   Dessa Phi, DO [facility] [email or web address] Password [facility] 03/07/2017, 8:56 AM

## 2017-03-08 LAB — CBC
HEMATOCRIT: 32.5 % — AB (ref 36.0–46.0)
Hemoglobin: 10.8 g/dL — ABNORMAL LOW (ref 12.0–15.0)
MCH: 29.3 pg (ref 26.0–34.0)
MCHC: 33.2 g/dL (ref 30.0–36.0)
MCV: 88.1 fL (ref 78.0–100.0)
PLATELETS: 195 10*3/uL (ref 150–400)
RBC: 3.69 MIL/uL — ABNORMAL LOW (ref 3.87–5.11)
RDW: 14.4 % (ref 11.5–15.5)
WBC: 11.8 10*3/uL — AB (ref 4.0–10.5)

## 2017-03-08 LAB — BASIC METABOLIC PANEL
Anion gap: 5 (ref 5–15)
BUN: 9 mg/dL (ref 6–20)
CO2: 24 mmol/L (ref 22–32)
Calcium: 8.7 mg/dL — ABNORMAL LOW (ref 8.9–10.3)
Chloride: 110 mmol/L (ref 101–111)
Creatinine, Ser: 0.46 mg/dL (ref 0.44–1.00)
GFR calc Af Amer: >60 mL/min (ref >60)
GLUCOSE: 98 mg/dL (ref 65–99)
POTASSIUM: 3.4 mmol/L — AB (ref 3.5–5.1)
Sodium: 139 mmol/L (ref 135–145)

## 2017-03-08 LAB — T3, FREE: T3, Free: 2.2 pg/mL (ref 2.0–4.4)

## 2017-03-08 MED ORDER — METOPROLOL TARTRATE 25 MG PO TABS: 12.5000 mg | ORAL_TABLET | Freq: Two times a day (BID) | ORAL | 0 refills | Status: AC

## 2017-03-08 MED ORDER — POLYETHYLENE GLYCOL 3350 17 G PO PACK: 17.0000 g | PACK | Freq: Every day | ORAL | 0 refills | Status: DC

## 2017-03-08 MED ORDER — HEPARIN SOD (PORK) LOCK FLUSH 100 UNIT/ML IV SOLN
500.0000 [IU] | INTRAVENOUS | Status: AC | PRN
  Administered 2017-03-08: 500 [IU]

## 2017-03-08 MED ORDER — TAMOXIFEN CITRATE 10 MG PO TABS
10.0000 mg | ORAL_TABLET | Freq: Two times a day (BID) | ORAL | Status: DC
  Administered 2017-03-08: 10 mg via ORAL
  Filled 2017-03-08 (×2): qty 1

## 2017-03-08 MED ORDER — POTASSIUM CHLORIDE CRYS ER 20 MEQ PO TBCR
40.0000 meq | EXTENDED_RELEASE_TABLET | Freq: Once | ORAL | Status: AC
  Administered 2017-03-08: 40 meq via ORAL
  Filled 2017-03-08: qty 2

## 2017-03-08 MED ORDER — OXYBUTYNIN CHLORIDE 5 MG PO TABS: 5.0000 mg | ORAL_TABLET | Freq: Three times a day (TID) | ORAL | 0 refills | Status: AC | PRN

## 2017-03-08 MED ORDER — CEFPODOXIME PROXETIL 200 MG PO TABS: 200.0000 mg | ORAL_TABLET | Freq: Two times a day (BID) | ORAL | 0 refills | Status: AC

## 2017-03-08 NOTE — Progress Notes (Signed)
Patient is stable for discharge. Discharge instructions and medications have been reviewed with the patient and all questions answered.   

## 2017-03-08 NOTE — Progress Notes (Signed)
Assumed care of patient at this time. Patient is stable with no complaints at this time. Agree with previously documented assessment. Will continue to monitor patient.   

## 2017-03-08 NOTE — Discharge Summary (Signed)
Physician Discharge Summary  Laurie Wise OAC:[id number] DOB: [birth year] DOA: 03/06/2017  PCP: Patient, No Pcp Per  Admit date: 03/06/2017 Discharge date: 03/08/2017  Admitted From: Home Disposition: Home  Recommendations for Outpatient Follow-up:  1. Follow up with PCP in 1 week 2. Follow up with Dr. Diona Fanti (he will schedule follow-up) 3. Please follow up on the following pending results: Blood cultures  Home Health: None Equipment/Devices: None  Discharge Condition: Stable CODE STATUS: Full code Diet recommendation: Regular diet   Brief/Interim Summary:  Admission HPI written by Franchot Gallo, MD   HPI: Laurie Wise is a 61 y.o. female with medical history significant of ALS, recurrent UTI, depression,     Presented with 3 day hx of dysuria and abdominal pain she presented to urgent care and was diagnosed with UTI start the Bactrim. She endorses some nausea and vomiting she has prior history of kidney stones and feels similar. She denies any syncope chest pain shortness of breath cough diarrhea. She continued to have fevers and chills and presented to emergency department.  In ER CT scan found 7 mm right distal stone with proximal hydroureteronephrosis. Her urine was significant for probable UTI and urology was consulted She was taken to or on 03/06/2017 and underwent cystoscopy with double J stent placement   Regarding pertinent Chronic problems: She has known history of ALS walks with a walker. She reports prior history of UTIs she have had at least 3 this year. History of left breast cancer status post resection and radiation therapy     Hospital course:  Sepsis secondary to pyonephrosis S/p cystoscopy with double J stent placement on 8/20 by Dr. Diona Fanti. Urine cultures significant for multiple species. Cefepime transitioned to Vantin to complete a 10 day total course. Blood culture negative to date. Leukocytosis improved. Patient to follow-up with Dr. Diona Fanti as an  outpatient.  Lactic acidosis Improved with IV fluids   Low TSH High free T4 Patient already with a history of thyroid nodule. Free T4 of 1.30. Unknown workup so far per discussion with patient. Started metoprolol for symptoms of tachycardia and tremulousness. She will need follow-up  Sinus tachycardia Possibly secondary to elevated T4. Prescribed metoprolol.  ALS  Supportive care, walks with walker at baseline. Continued home riluzole   Hx breast cancer s/p resection and radiation Continued tamoxifen   Depression Continued effexor  Discharge Diagnoses:  Principal Problem:   Pyonephrosis Active Problems:   Sepsis ([place])   ALS (amyotrophic lateral sclerosis) ([place])    Discharge Instructions   Allergies as of 03/08/2017      Reactions   Adhesive [tape] Other (See Comments)   Blisters       Medication List    STOP taking these medications   sulfamethoxazole-trimethoprim 800-160 MG tablet Commonly known as:  BACTRIM DS,SEPTRA DS     TAKE these medications   acetaminophen 500 MG tablet Commonly known as:  TYLENOL Take 1,000 mg by mouth every 6 (six) hours as needed for mild pain, moderate pain, fever or headache.   B COMPLEX 50 Tabs Take 2 tablets by mouth daily.   cefpodoxime 200 MG tablet Commonly known as:  VANTIN Take 1 tablet (200 mg total) by mouth 2 (two) times daily.   ESTER-C Tabs Take 2 tablets by mouth daily.   ibuprofen 200 MG tablet Commonly known as:  ADVIL,MOTRIN Take 600-800 mg by mouth every 6 (six) hours as needed for fever, headache, mild pain, moderate pain or cramping.   IRON PO Take 4 tablets  by mouth daily.   metoprolol tartrate 25 MG tablet Commonly known as:  LOPRESSOR Take 0.5 tablets (12.5 mg total) by mouth 2 (two) times daily. Take if having worsening symptoms of tremors, palpitations.   multivitamin with minerals Tabs tablet Take 2 tablets by mouth daily.   OMEGA-3 + D PO Take 5 mLs by mouth daily.   OVER THE  COUNTER MEDICATION 1 scoop daily. *Trehalose Power* = sweetner   oxybutynin 5 MG tablet Commonly known as:  DITROPAN Take 1 tablet (5 mg total) by mouth every 8 (eight) hours as needed for bladder spasms.   oxyCODONE-acetaminophen 5-325 MG tablet Commonly known as:  PERCOCET/ROXICET Take 1 tablet by mouth once.   polyethylene glycol packet Commonly known as:  MIRALAX / GLYCOLAX Take 17 g by mouth daily.   Probiotic Caps Take 1 capsule by mouth daily.   RADICAVA 30 MG/100ML Soln Generic drug:  Edaravone Inject 30 mg into the vein See admin instructions. 10 days on and 18 days off [facility] = Mingo Amber, RN [phone] [place], [place]   RILUZOLE PO Take 2 tablets by mouth daily.   tamoxifen 10 MG tablet Commonly known as:  NOLVADEX Take 20 mg by mouth daily.   TL FOLATE PO Take 1 tablet by mouth daily.   TURMERIC PO Take 2 tablets by mouth daily.   Venlafaxine HCl 150 MG Tb24 Take 150 mg by mouth daily.   VITAMIN B-12 SL Place 1 tablet under the tongue daily.   VITAMIN D PO Take 1 tablet by mouth daily.   vitamin E 400 UNIT capsule Take 1,200 Units by mouth daily.            Discharge Care Instructions        Start     Ordered   03/08/17 0000  polyethylene glycol (MIRALAX / GLYCOLAX) packet  Daily     03/08/17 1152   03/08/17 0000  oxybutynin (DITROPAN) 5 MG tablet  Every 8 hours PRN     03/08/17 1737   03/08/17 0000  cefpodoxime (VANTIN) 200 MG tablet  2 times daily     03/08/17 1737   03/08/17 0000  metoprolol tartrate (LOPRESSOR) 25 MG tablet  2 times daily     03/08/17 1737      Allergies  Allergen Reactions  . Adhesive [Tape] Other (See Comments)    Blisters     Consultations:  Urology   Procedures/Studies: Ct Abdomen Pelvis W Contrast  Result Date: 03/06/2017 CLINICAL DATA:  Abdominal and pelvic pain since Friday. Hematuria. Diarrhea. Pyelonephritis. EXAM: CT ABDOMEN AND PELVIS WITH CONTRAST TECHNIQUE: Multidetector CT imaging of  the abdomen and pelvis was performed using the standard protocol following bolus administration of intravenous contrast. CONTRAST:  12mL ISOVUE-300 IOPAMIDOL (ISOVUE-300) INJECTION 61% COMPARISON:  None. FINDINGS: Lower chest: Subsegmental atelectasis at the lung bases. Mild cardiomegaly. Incompletely imaged central line. Hepatobiliary: Focal steatosis adjacent the falciform ligament. Normal gallbladder, without biliary ductal dilatation. Pancreas: Normal, without mass or ductal dilatation. Spleen: Normal in size, without focal abnormality. Adrenals/Urinary Tract: Normal adrenal glands. Bilateral renal collecting system calculi. Moderate right-sided hydroureteronephrosis with decreased excretion/ function involving the right kidney. This continues to the level of a distal right ureteric 5x7 mm stone on image 73/series 2 and coronal image 63. Heterogeneous right renal enhancement is most apparent in the lower pole on image 29/ series 7. Normal urinary bladder. Stomach/Bowel: Normal stomach, without wall thickening. Normal colon and terminal ileum. Normal small bowel. Vascular/Lymphatic: Aortic and branch vessel atherosclerosis.  No abdominopelvic adenopathy. Reproductive: Normal uterus and adnexa. Other: No significant free fluid. Musculoskeletal: Lumbosacral spondylosis. IMPRESSION: 1. Moderate right-sided urinary tract obstruction secondary to a distal right ureteric stone. 2. Heterogeneous right renal enhancement, likely representing concurrent pyelonephritis. 3. Bilateral nephrolithiasis. 4.  Aortic Atherosclerosis (ICD10-I70.0). Electronically Signed   By: Abigail Miyamoto M.D.   On: 03/06/2017 10:42   Dg C-arm 1-60 Min-no Report  Result Date: 03/06/2017 Fluoroscopy was utilized by the requesting physician.  No radiographic interpretation.     Subjective: Some urinary frequency and pressure. No dysuria.  Discharge Exam: Vitals:   03/08/17 0710 03/08/17 1409  BP:  (!) 143/81  Pulse:  (!) 108  Resp:   18  Temp: 99.1 F (37.3 C) 98.6 F (37 C)  SpO2:  97%   Vitals:   03/07/17 2044 03/08/17 0540 03/08/17 0710 03/08/17 1409  BP: (!) 150/83 136/71  (!) 143/81  Pulse: (!) 116 (!) 113  (!) 108  Resp: 18 19  18   Temp: 97.9 F (36.6 C) 97.7 F (36.5 C) 99.1 F (37.3 C) 98.6 F (37 C)  TempSrc: Oral Axillary Oral Oral  SpO2: 98% 97%  97%  Weight:      Height:        General: Pt is alert, awake, not in acute distress Cardiovascular: RRR, S1/S2 +, no rubs, no gallops Respiratory: CTA bilaterally, no wheezing, no rhonchi Abdominal: Soft, NT, ND, bowel sounds + Extremities: no edema, no cyanosis    The results of significant diagnostics from this hospitalization (including imaging, microbiology, ancillary and laboratory) are listed below for reference.     Microbiology: Recent Results (from the past 240 hour(s))  Urine culture     Status: Abnormal   Collection Time: 03/06/17  7:37 AM  Result Value Ref Range Status   Specimen Description URINE, RANDOM  Final   Special Requests NONE  Final   Culture MULTIPLE SPECIES PRESENT, SUGGEST RECOLLECTION (A)  Final   Report Status 03/07/2017 FINAL  Final  Culture, blood (x 2)     Status: None (Preliminary result)   Collection Time: 03/06/17  7:40 PM  Result Value Ref Range Status   Specimen Description BLOOD LEFT ANTECUBITAL  Final   Special Requests   Final    BOTTLES DRAWN AEROBIC AND ANAEROBIC Blood Culture adequate volume   Culture   Final    NO GROWTH 2 DAYS Performed at [facility], [address] [place], [place] [address]    Report Status PENDING  Incomplete  Culture, blood (x 2)     Status: None (Preliminary result)   Collection Time: 03/06/17  7:46 PM  Result Value Ref Range Status   Specimen Description BLOOD LEFT HAND  Final   Special Requests   Final    BOTTLES DRAWN AEROBIC AND ANAEROBIC Blood Culture adequate volume   Culture   Final    NO GROWTH 2 DAYS Performed at [facility], [place] [address] [place], [place] [address]    Report Status PENDING  Incomplete     Labs: BNP (last 3 results) No results for input(s): BNP in the last 8760 hours. Basic Metabolic Panel:  Recent Labs Lab 03/06/17 0820 03/06/17 1327 03/07/17 0155 03/08/17 0324  NA 137  --  140 139  K 3.6  --  3.9 3.4*  CL 104  --  112* 110  CO2 24  --  22 24  GLUCOSE 91  --  200* 98  BUN 9  --  10 9  CREATININE 0.52 0.52 0.44 0.46  CALCIUM 9.4  --  8.7* 8.7*  MG  --   --  2.0  --   PHOS  --   --  1.4*  --    Liver Function Tests:  Recent Labs Lab 03/06/17 0820 03/07/17 0155  AST 49* 41  ALT 64* 47  ALKPHOS 71 60  BILITOT 0.8 0.2*  PROT 7.1 6.0*  ALBUMIN 3.7 3.0*   No results for input(s): LIPASE, AMYLASE in the last 168 hours. No results for input(s): AMMONIA in the last 168 hours. CBC:  Recent Labs Lab 03/06/17 0820 03/06/17 1416 03/07/17 0155 03/08/17 0324  WBC 17.9* 17.1* 12.2* 11.8*  NEUTROABS 16.3*  --   --   --   HGB 13.3 13.0 11.5* 10.8*  HCT 39.3 39.0 34.4* 32.5*  MCV 87.1 88.4 89.4 88.1  PLT 163 160 148* 195   Cardiac Enzymes: No results for input(s): CKTOTAL, CKMB, CKMBINDEX, TROPONINI in the last 168 hours. BNP: Invalid input(s): POCBNP CBG: No results for input(s): GLUCAP in the last 168 hours. D-Dimer No results for input(s): DDIMER in the last 72 hours. Hgb A1c No results for input(s): HGBA1C in the last 72 hours. Lipid Profile No results for input(s): CHOL, HDL, LDLCALC, TRIG, CHOLHDL, LDLDIRECT in the last 72 hours. Thyroid function studies  Recent Labs  03/06/17 1942 03/07/17 1859  TSH 0.224*  --   T3FREE  --  2.2   Anemia work up No results for input(s): VITAMINB12, FOLATE, FERRITIN, TIBC, IRON, RETICCTPCT in the last 72 hours. Urinalysis    Component Value Date/Time   COLORURINE YELLOW 03/06/2017 0737   APPEARANCEUR CLOUDY (A) 03/06/2017 0737   LABSPEC 1.015 03/06/2017 0737   PHURINE 6.0 03/06/2017 0737   GLUCOSEU NEGATIVE 03/06/2017 0737    HGBUR MODERATE (A) 03/06/2017 0737   BILIRUBINUR NEGATIVE 03/06/2017 0737   KETONESUR 80 (A) 03/06/2017 0737   PROTEINUR 100 (A) 03/06/2017 0737   NITRITE NEGATIVE 03/06/2017 0737   LEUKOCYTESUR LARGE (A) 03/06/2017 0737   Sepsis Labs Invalid input(s): PROCALCITONIN,  WBC,  LACTICIDVEN Microbiology Recent Results (from the past 240 hour(s))  Urine culture     Status: Abnormal   Collection Time: 03/06/17  7:37 AM  Result Value Ref Range Status   Specimen Description URINE, RANDOM  Final   Special Requests NONE  Final   Culture MULTIPLE SPECIES PRESENT, SUGGEST RECOLLECTION (A)  Final   Report Status 03/07/2017 FINAL  Final  Culture, blood (x 2)     Status: None (Preliminary result)   Collection Time: 03/06/17  7:40 PM  Result Value Ref Range Status   Specimen Description BLOOD LEFT ANTECUBITAL  Final   Special Requests   Final    BOTTLES DRAWN AEROBIC AND ANAEROBIC Blood Culture adequate volume   Culture   Final    NO GROWTH 2 DAYS Performed at [facility], [place] [address], [place], [place] [address]    Report Status PENDING  Incomplete  Culture, blood (x 2)     Status: None (Preliminary result)   Collection Time: 03/06/17  7:46 PM  Result Value Ref Range Status   Specimen Description BLOOD LEFT HAND  Final   Special Requests   Final    BOTTLES DRAWN AEROBIC AND ANAEROBIC Blood Culture adequate volume   Culture   Final    NO GROWTH 2 DAYS Performed at [facility], [place] [address] [place], [place] [address]    Report Status  PENDING  Incomplete    SIGNED:   Cordelia Poche, MD [facility] 03/08/2017, 5:37 PM Pager [phone]  If 7PM-7AM, please contact night-coverage [email or web address] Password [email or web address]

## 2017-03-08 NOTE — Discharge Instructions (Signed)
[place] had an infection that affected your urinary system. You had stents placed to alleviate symptoms. You will go home on antibiotics and need follow-up with the urologist. Incidentally, your TSH was low and your t4 was high. This can be indicative of hyperfunctioning thyroid. This can be a cause of your fast heart rate. I will start you on a low dose beta-blocker to help with symptoms. This will need to be followed up with your primary doctor.

## 2017-03-08 NOTE — Progress Notes (Addendum)
2 Days Post-Op Subjective: Patient reports feeling much better.  Objective: Vital signs in last 24 hours: Temp:  [97.7 F (36.5 C)-99.1 F (37.3 C)] 98.6 F (37 C) (08/22 1409) Pulse Rate:  [108-116] 108 (08/22 1409) Resp:  [18-19] 18 (08/22 1409) BP: (136-150)/(71-83) 143/81 (08/22 1409) SpO2:  [97 %-98 %] 97 % (08/22 1409)  Intake/Output from previous day: 08/21 0701 - 08/22 0700 In: 450 [P.O.:120; I.V.:230; IV Piggyback:100] Out: 650 [Urine:650] Intake/Output this shift: Total I/O In: 50 [IV Piggyback:50] Out: -   Physical Exam:  Constitutional: Vital signs reviewed. WD WN in NAD   Eyes: PERRL, No scleral icterus.   Pulmonary/Chest: Normal effort Extremities: No cyanosis or edema   Cultures negative  Lab Results:  Recent Labs  03/06/17 1416 03/07/17 0155 03/08/17 0324  HGB 13.0 11.5* 10.8*  HCT 39.0 34.4* 32.5*   BMET  Recent Labs  03/07/17 0155 03/08/17 0324  NA 140 139  K 3.9 3.4*  CL 112* 110  CO2 22 24  GLUCOSE 200* 98  BUN 10 9  CREATININE 0.44 0.46  CALCIUM 8.7* 8.7*   No results for input(s): LABPT, INR in the last 72 hours. No results for input(s): LABURIN in the last 72 hours. Results for orders placed or performed during the hospital encounter of 03/06/17  Urine culture     Status: Abnormal   Collection Time: 03/06/17  7:37 AM  Result Value Ref Range Status   Specimen Description URINE, RANDOM  Final   Special Requests NONE  Final   Culture MULTIPLE SPECIES PRESENT, SUGGEST RECOLLECTION (A)  Final   Report Status 03/07/2017 FINAL  Final  Culture, blood (x 2)     Status: None (Preliminary result)   Collection Time: 03/06/17  7:40 PM  Result Value Ref Range Status   Specimen Description BLOOD LEFT ANTECUBITAL  Final   Special Requests   Final    BOTTLES DRAWN AEROBIC AND ANAEROBIC Blood Culture adequate volume   Culture   Final    NO GROWTH 2 DAYS Performed at [facility], [place] [address], [place], [place] [address]    Report Status PENDING  Incomplete  Culture, blood (x 2)     Status: None (Preliminary result)   Collection Time: 03/06/17  7:46 PM  Result Value Ref Range Status   Specimen Description BLOOD LEFT HAND  Final   Special Requests   Final    BOTTLES DRAWN AEROBIC AND ANAEROBIC Blood Culture adequate volume   Culture   Final    NO GROWTH 2 DAYS Performed at [facility], [place] [address], [place], [place] [address]    Report Status PENDING  Incomplete    Studies/Results: No results found.  Assessment/Plan:   Doing well POD 2 from stenting obstructing stone. OK w/ d/c on 10 full days of abx  I'll call to schedule followups    LOS: 1 day   Laurie Wise M 03/08/2017, 6:39 PM  27 minutes spent w/ pt/husband

## 2017-03-10 ENCOUNTER — Other Ambulatory Visit: Payer: Self-pay | Admitting: Urology

## 2017-03-11 LAB — CULTURE, BLOOD (ROUTINE X 2)
CULTURE: NO GROWTH
Culture: NO GROWTH
SPECIAL REQUESTS: ADEQUATE
Special Requests: ADEQUATE

## 2017-03-16 DIAGNOSIS — N201 Calculus of ureter: Secondary | ICD-10-CM | POA: Diagnosis not present

## 2017-03-16 DIAGNOSIS — R310 Gross hematuria: Secondary | ICD-10-CM | POA: Diagnosis not present

## 2017-03-17 ENCOUNTER — Encounter (HOSPITAL_BASED_OUTPATIENT_CLINIC_OR_DEPARTMENT_OTHER): Payer: Self-pay | Admitting: *Deleted

## 2017-03-17 DIAGNOSIS — R000 Tachycardia, unspecified: Secondary | ICD-10-CM | POA: Diagnosis not present

## 2017-03-17 DIAGNOSIS — E042 Nontoxic multinodular goiter: Secondary | ICD-10-CM | POA: Diagnosis not present

## 2017-03-17 NOTE — Progress Notes (Addendum)
NPO AFTER MN.  ARRIVE AT 0175.  CURRENT LAB RESULTS AND EKG IN CHART AND EPIC.  WILL TAKE RX AM MEDS W/ SIPS OF WATER .  WILL BRING CPAP.

## 2017-03-20 ENCOUNTER — Encounter (HOSPITAL_COMMUNITY): Payer: Self-pay | Admitting: *Deleted

## 2017-03-20 DIAGNOSIS — B9620 Unspecified Escherichia coli [E. coli] as the cause of diseases classified elsewhere: Secondary | ICD-10-CM | POA: Diagnosis not present

## 2017-03-20 DIAGNOSIS — I10 Essential (primary) hypertension: Secondary | ICD-10-CM | POA: Insufficient documentation

## 2017-03-20 DIAGNOSIS — N132 Hydronephrosis with renal and ureteral calculous obstruction: Secondary | ICD-10-CM | POA: Diagnosis not present

## 2017-03-20 DIAGNOSIS — Z9989 Dependence on other enabling machines and devices: Secondary | ICD-10-CM | POA: Insufficient documentation

## 2017-03-20 DIAGNOSIS — G4733 Obstructive sleep apnea (adult) (pediatric): Secondary | ICD-10-CM | POA: Diagnosis not present

## 2017-03-20 DIAGNOSIS — Z853 Personal history of malignant neoplasm of breast: Secondary | ICD-10-CM | POA: Diagnosis not present

## 2017-03-20 DIAGNOSIS — Z888 Allergy status to other drugs, medicaments and biological substances status: Secondary | ICD-10-CM | POA: Diagnosis not present

## 2017-03-20 DIAGNOSIS — R509 Fever, unspecified: Secondary | ICD-10-CM | POA: Diagnosis present

## 2017-03-20 DIAGNOSIS — N390 Urinary tract infection, site not specified: Secondary | ICD-10-CM | POA: Diagnosis not present

## 2017-03-20 DIAGNOSIS — R079 Chest pain, unspecified: Secondary | ICD-10-CM | POA: Insufficient documentation

## 2017-03-20 DIAGNOSIS — G1221 Amyotrophic lateral sclerosis: Secondary | ICD-10-CM | POA: Insufficient documentation

## 2017-03-20 DIAGNOSIS — Z923 Personal history of irradiation: Secondary | ICD-10-CM | POA: Diagnosis not present

## 2017-03-20 DIAGNOSIS — Z79899 Other long term (current) drug therapy: Secondary | ICD-10-CM | POA: Insufficient documentation

## 2017-03-20 DIAGNOSIS — T83192A Other mechanical complication of urinary stent, initial encounter: Secondary | ICD-10-CM | POA: Diagnosis not present

## 2017-03-20 NOTE — ED Triage Notes (Signed)
EDP is to see pt not Urology

## 2017-03-20 NOTE — ED Triage Notes (Signed)
Pt had urethral stint placed 2 weeks ago, tonight developed low grade fever, spoke with McKenzie at [facility] to be called when pt arrives

## 2017-03-21 ENCOUNTER — Observation Stay (HOSPITAL_COMMUNITY): Payer: BLUE CROSS/BLUE SHIELD

## 2017-03-21 ENCOUNTER — Observation Stay (HOSPITAL_COMMUNITY)
Admission: EM | Admit: 2017-03-21 | Discharge: 2017-03-23 | Disposition: A | Discharge status: Home / Self Care | Payer: BLUE CROSS/BLUE SHIELD | Attending: Internal Medicine | Admitting: Internal Medicine

## 2017-03-21 DIAGNOSIS — R509 Fever, unspecified: Secondary | ICD-10-CM | POA: Diagnosis not present

## 2017-03-21 DIAGNOSIS — N200 Calculus of kidney: Secondary | ICD-10-CM | POA: Diagnosis not present

## 2017-03-21 DIAGNOSIS — Z960 Presence of urogenital implants: Secondary | ICD-10-CM

## 2017-03-21 DIAGNOSIS — N390 Urinary tract infection, site not specified: Secondary | ICD-10-CM

## 2017-03-21 DIAGNOSIS — G1221 Amyotrophic lateral sclerosis: Secondary | ICD-10-CM

## 2017-03-21 DIAGNOSIS — R309 Painful micturition, unspecified: Secondary | ICD-10-CM | POA: Diagnosis not present

## 2017-03-21 DIAGNOSIS — R079 Chest pain, unspecified: Secondary | ICD-10-CM | POA: Diagnosis not present

## 2017-03-21 DIAGNOSIS — N3000 Acute cystitis without hematuria: Secondary | ICD-10-CM | POA: Diagnosis not present

## 2017-03-21 DIAGNOSIS — N10 Acute tubulo-interstitial nephritis: Secondary | ICD-10-CM | POA: Diagnosis not present

## 2017-03-21 LAB — CBC WITH DIFFERENTIAL/PLATELET
BASOS ABS: 0.1 10*3/uL (ref 0.0–0.1)
BASOS PCT: 0 %
Eosinophils Absolute: 0.2 10*3/uL (ref 0.0–0.7)
Eosinophils Relative: 1 %
HEMATOCRIT: 37.2 % (ref 36.0–46.0)
HEMOGLOBIN: 12.3 g/dL (ref 12.0–15.0)
Lymphocytes Relative: 13 %
Lymphs Abs: 1.9 10*3/uL (ref 0.7–4.0)
MCH: 29.4 pg (ref 26.0–34.0)
MCHC: 33.1 g/dL (ref 30.0–36.0)
MCV: 89 fL (ref 78.0–100.0)
MONOS PCT: 6 %
Monocytes Absolute: 0.9 10*3/uL (ref 0.1–1.0)
NEUTROS ABS: 11.8 10*3/uL — AB (ref 1.7–7.7)
NEUTROS PCT: 80 %
Platelets: 250 10*3/uL (ref 150–400)
RBC: 4.18 MIL/uL (ref 3.87–5.11)
RDW: 13.8 % (ref 11.5–15.5)
WBC: 14.8 10*3/uL — ABNORMAL HIGH (ref 4.0–10.5)

## 2017-03-21 LAB — URINALYSIS, ROUTINE W REFLEX MICROSCOPIC
Bacteria, UA: NONE SEEN
Bilirubin Urine: NEGATIVE
Glucose, UA: NEGATIVE mg/dL
Ketones, ur: NEGATIVE mg/dL
Nitrite: POSITIVE — AB
Protein, ur: 100 mg/dL — AB
Specific Gravity, Urine: 1.013 (ref 1.005–1.030)
pH: 5 (ref 5.0–8.0)

## 2017-03-21 LAB — BASIC METABOLIC PANEL WITH GFR
Anion gap: 7 (ref 5–15)
BUN: 13 mg/dL (ref 6–20)
CO2: 26 mmol/L (ref 22–32)
Calcium: 9.1 mg/dL (ref 8.9–10.3)
Chloride: 105 mmol/L (ref 101–111)
Creatinine, Ser: 0.44 mg/dL (ref 0.44–1.00)
GFR calc Af Amer: >60 mL/min
GFR calc non Af Amer: >60 mL/min
Glucose, Bld: 100 mg/dL — ABNORMAL HIGH (ref 65–99)
Potassium: 3.7 mmol/L (ref 3.5–5.1)
Sodium: 138 mmol/L (ref 135–145)

## 2017-03-21 LAB — I-STAT CG4 LACTIC ACID, ED: Lactic Acid, Venous: 0.55 mmol/L (ref 0.5–1.9)

## 2017-03-21 MED ORDER — DEXTROSE 5 % IV SOLN
1.0000 g | Freq: Once | INTRAVENOUS | Status: AC
  Administered 2017-03-21: 1 g via INTRAVENOUS
  Filled 2017-03-21: qty 10

## 2017-03-21 MED ORDER — ACETAMINOPHEN 500 MG PO TABS
1000.0000 mg | ORAL_TABLET | Freq: Four times a day (QID) | ORAL | Status: DC | PRN
  Administered 2017-03-21: 1000 mg via ORAL
  Filled 2017-03-21: qty 2

## 2017-03-21 MED ORDER — ONDANSETRON HCL 4 MG/2ML IJ SOLN: 4.0000 mg | Freq: Four times a day (QID) | INTRAMUSCULAR | Status: DC | PRN

## 2017-03-21 MED ORDER — METOPROLOL TARTRATE 25 MG PO TABS
12.5000 mg | ORAL_TABLET | Freq: Two times a day (BID) | ORAL | Status: DC
  Administered 2017-03-21 – 2017-03-23 (×5): 12.5 mg via ORAL
  Filled 2017-03-21 (×6): qty 1

## 2017-03-21 MED ORDER — B COMPLEX-C PO TABS
2.0000 | ORAL_TABLET | Freq: Every day | ORAL | Status: DC
  Administered 2017-03-21 – 2017-03-23 (×3): 2 via ORAL
  Filled 2017-03-21 (×3): qty 2

## 2017-03-21 MED ORDER — ACETAMINOPHEN 650 MG RE SUPP: 650.0000 mg | Freq: Four times a day (QID) | RECTAL | Status: DC | PRN

## 2017-03-21 MED ORDER — DEXTROSE 5 % IV SOLN
1.0000 g | INTRAVENOUS | Status: DC
  Administered 2017-03-21 – 2017-03-22 (×2): 1 g via INTRAVENOUS
  Filled 2017-03-21 (×2): qty 10

## 2017-03-21 MED ORDER — VITAMIN E 180 MG (400 UNIT) PO CAPS
1200.0000 [IU] | ORAL_CAPSULE | Freq: Every day | ORAL | Status: DC
  Administered 2017-03-21 – 2017-03-23 (×3): 1200 [IU] via ORAL
  Filled 2017-03-21 (×3): qty 3

## 2017-03-21 MED ORDER — SODIUM CHLORIDE 0.9% FLUSH: 10.0000 mL | INTRAVENOUS | Status: DC | PRN

## 2017-03-21 MED ORDER — TAMOXIFEN CITRATE 10 MG PO TABS
10.0000 mg | ORAL_TABLET | Freq: Two times a day (BID) | ORAL | Status: DC
  Administered 2017-03-21 – 2017-03-23 (×5): 10 mg via ORAL
  Filled 2017-03-21 (×5): qty 1

## 2017-03-21 MED ORDER — IBUPROFEN 200 MG PO TABS: 600.0000 mg | ORAL_TABLET | Freq: Four times a day (QID) | ORAL | Status: DC | PRN

## 2017-03-21 MED ORDER — OXYBUTYNIN CHLORIDE 5 MG PO TABS
5.0000 mg | ORAL_TABLET | Freq: Three times a day (TID) | ORAL | Status: DC | PRN
  Administered 2017-03-21 – 2017-03-23 (×4): 5 mg via ORAL
  Filled 2017-03-21 (×4): qty 1

## 2017-03-21 MED ORDER — VENLAFAXINE HCL ER 150 MG PO CP24
150.0000 mg | ORAL_CAPSULE | Freq: Every day | ORAL | Status: DC
  Administered 2017-03-21 – 2017-03-23 (×3): 150 mg via ORAL
  Filled 2017-03-21 (×3): qty 1

## 2017-03-21 MED ORDER — ACETAMINOPHEN 325 MG PO TABS: 650.0000 mg | ORAL_TABLET | Freq: Four times a day (QID) | ORAL | Status: DC | PRN

## 2017-03-21 MED ORDER — EDARAVONE 30 MG/100ML IV SOLN: 30.0000 mg | Freq: Every day | INTRAVENOUS | Status: DC

## 2017-03-21 MED ORDER — ADULT MULTIVITAMIN W/MINERALS CH
1.0000 | ORAL_TABLET | Freq: Two times a day (BID) | ORAL | Status: DC
  Administered 2017-03-21 – 2017-03-23 (×5): 1 via ORAL
  Filled 2017-03-21 (×5): qty 1

## 2017-03-21 MED ORDER — ENOXAPARIN SODIUM 40 MG/0.4ML ~~LOC~~ SOLN
40.0000 mg | SUBCUTANEOUS | Status: DC
  Administered 2017-03-21 – 2017-03-22 (×2): 40 mg via SUBCUTANEOUS
  Filled 2017-03-21 (×3): qty 0.4

## 2017-03-21 MED ORDER — IBUPROFEN 200 MG PO TABS
600.0000 mg | ORAL_TABLET | Freq: Once | ORAL | Status: AC
  Administered 2017-03-21: 600 mg via ORAL
  Filled 2017-03-21: qty 3

## 2017-03-21 MED ORDER — ONDANSETRON HCL 4 MG PO TABS: 4.0000 mg | ORAL_TABLET | Freq: Four times a day (QID) | ORAL | Status: DC | PRN

## 2017-03-21 MED ORDER — RILUZOLE 50 MG PO TABS
50.0000 mg | ORAL_TABLET | Freq: Two times a day (BID) | ORAL | Status: DC
  Administered 2017-03-21 – 2017-03-23 (×5): 50 mg via ORAL

## 2017-03-21 NOTE — ED Notes (Signed)
Pt ambulated to restroom with her cane. A&Ox4. No distress at this time.

## 2017-03-21 NOTE — Consult Note (Signed)
Urology Consult  Referring physician: Dr. Charlies Silvers Reason for referral: fever with indwelling ureteral stent  Chief Complaint: dysuria  History of Present Illness: [name] is a 61yo with a hx of ALS and nephrolithiasis who presented to the ER last night with a fever of 101. She is s/p Right ureteral stent placement for sepsis on 03/06/2017. She had finsihed her course of antibiotics last week and then developed a fever yesterday. She has associated suprapubic pressure, urinary urgency and dysuria. No hematuria. UA is concerning for infection. She denies any flank pain currently.   Past Medical History:  Diagnosis Date  . Ductal carcinoma in situ (DCIS) of left breast  [facility] in [place]--- per pt no recurrence   dx s/p  left breast lumpectomy and radiation  . History of acute pyelonephritis 03/06/2017   w/ sepsis due to ureteral stone obstruction  . History of radiation therapy 2016   left breast   . Left ureteral stone   . [facility] disease [facility])   . Nephrolithiasis    bilateral non-obstructive per CT 03-06-2017  . OSA on CPAP    setting 8  . Sensation of pressure in bladder area   . Tachycardia    taking beta blocker  . Uses walker   . Wears glasses    Past Surgical History:  Procedure Laterality Date  . BREAST LUMPECTOMY Left 2016   [facility] in [place]   DCIS  . CESAREAN SECTION  1990  . CYSTOSCOPY WITH STENT PLACEMENT Right 03/06/2017   Procedure: CYSTOSCOPY WITH RIGHT STENT PLACEMENT;  Surgeon: Franchot Gallo, MD;  Location: [facility];  Service: Urology;  Laterality: Right;  . LAMINECTOMY  2003   L4-5  . PORTA CATH INSERTION  08/2016   power port for ALS IV medication    Medications: I have reviewed the patient's current medications. Allergies:  Allergies  Allergen Reactions  . Adhesive [Tape] Other (See Comments)    Blisters     Family History  Problem Relation Age of Onset  . Parkinson's disease Mother   . Renal cancer Father    . CAD Father   . Diabetes Other   . Stroke Neg Hx   . Clotting disorder Neg Hx    Social History:  reports that she quit smoking about 46 years ago. Her smoking use included Cigarettes. She quit after 10.00 years of use. She has never used smokeless tobacco. She reports that she does not drink alcohol or use drugs.  Review of Systems  Constitutional: Positive for chills and fever.  Gastrointestinal: Positive for nausea.  Genitourinary: Positive for dysuria, frequency and urgency.  All other systems reviewed and are negative.   Physical Exam:  Vital signs in last 24 hours: Temp:  [98.4 F (36.9 C)-99.3 F (37.4 C)] 98.4 F (36.9 C) (09/04 0428) Pulse Rate:  [97-117] 100 (09/04 0428) Resp:  [14-16] 16 (09/04 0428) BP: (123-136)/(61-90) 136/90 (09/04 0428) SpO2:  [94 %-97 %] 95 % (09/04 0428) Weight:  [89.8 kg (198 lb)-89.9 kg (198 lb 3.2 oz)] 89.9 kg (198 lb 3.2 oz) (09/04 0432) Physical Exam  Constitutional: She is oriented to person, place, and time. She appears well-developed and well-nourished.  HENT:  Head: Atraumatic.  Eyes: Pupils are equal, round, and reactive to light. EOM are normal.  Neck: Normal range of motion. No thyromegaly present.  Cardiovascular: Normal rate and regular rhythm.   Respiratory: Effort normal. No respiratory distress.  GI: Soft. She exhibits no distension.  Musculoskeletal:  Normal range of motion. She exhibits no edema.  Neurological: She is alert and oriented to person, place, and time.  Skin: Skin is warm and dry.  Psychiatric: She has a normal mood and affect. Her behavior is normal. Judgment and thought content normal.    Laboratory Data:  Results for orders placed or performed during the hospital encounter of 03/21/17 (from the past 72 hour(s))  Urinalysis, Routine w reflex microscopic     Status: Abnormal   Collection Time: 03/21/17  1:05 AM  Result Value Ref Range   Color, Urine YELLOW YELLOW   APPearance CLOUDY (A) CLEAR    Specific Gravity, Urine 1.013 1.005 - 1.030   pH 5.0 5.0 - 8.0   Glucose, UA NEGATIVE NEGATIVE mg/dL   Hgb urine dipstick LARGE (A) NEGATIVE   Bilirubin Urine NEGATIVE NEGATIVE   Ketones, ur NEGATIVE NEGATIVE mg/dL   Protein, ur 100 (A) NEGATIVE mg/dL   Nitrite POSITIVE (A) NEGATIVE   Leukocytes, UA LARGE (A) NEGATIVE   RBC / HPF TOO NUMEROUS TO COUNT 0 - 5 RBC/hpf   WBC, UA TOO NUMEROUS TO COUNT 0 - 5 WBC/hpf   Bacteria, UA NONE SEEN NONE SEEN   Squamous Epithelial / LPF 0-5 (A) NONE SEEN   WBC Clumps PRESENT   Basic metabolic panel     Status: Abnormal   Collection Time: 03/21/17  1:15 AM  Result Value Ref Range   Sodium 138 135 - 145 mmol/L   Potassium 3.7 3.5 - 5.1 mmol/L   Chloride 105 101 - 111 mmol/L   CO2 26 22 - 32 mmol/L   Glucose, Bld 100 (H) 65 - 99 mg/dL   BUN 13 6 - 20 mg/dL   Creatinine, Ser 0.44 0.44 - 1.00 mg/dL   Calcium 9.1 8.9 - 10.3 mg/dL   GFR calc non Af Amer >60 >60 mL/min   GFR calc Af Amer >60 >60 mL/min    Comment: (NOTE) The eGFR has been calculated using the CKD EPI equation. This calculation has not been validated in all clinical situations. eGFR's persistently <60 mL/min signify possible Chronic Kidney Disease.    Anion gap 7 5 - 15  CBC with Differential     Status: Abnormal   Collection Time: 03/21/17  1:15 AM  Result Value Ref Range   WBC 14.8 (H) 4.0 - 10.5 K/uL   RBC 4.18 3.87 - 5.11 MIL/uL   Hemoglobin 12.3 12.0 - 15.0 g/dL   HCT 37.2 36.0 - 46.0 %   MCV 89.0 78.0 - 100.0 fL   MCH 29.4 26.0 - 34.0 pg   MCHC 33.1 30.0 - 36.0 g/dL   RDW 13.8 11.5 - 15.5 %   Platelets 250 150 - 400 K/uL   Neutrophils Relative % 80 %   Neutro Abs 11.8 (H) 1.7 - 7.7 K/uL   Lymphocytes Relative 13 %   Lymphs Abs 1.9 0.7 - 4.0 K/uL   Monocytes Relative 6 %   Monocytes Absolute 0.9 0.1 - 1.0 K/uL   Eosinophils Relative 1 %   Eosinophils Absolute 0.2 0.0 - 0.7 K/uL   Basophils Relative 0 %   Basophils Absolute 0.1 0.0 - 0.1 K/uL  I-Stat CG4 Lactic  Acid, ED     Status: None   Collection Time: 03/21/17  1:27 AM  Result Value Ref Range   Lactic Acid, Venous 0.55 0.5 - 1.9 mmol/L   No results found for this or any previous visit (from the past 240 hour(s)). Creatinine:  Recent  Labs  03/21/17 0115  CREATININE 0.44   Baseline Creatinine: 0.4  Impression/Assessment:  60yo with nephrolithiasis and likely pyelonephritis  Plan:  1. Pyelonephritis: Please continue rocephin pending urine culture 2. Nephrolithiasis: Pt was schedule for stone extraction on 03/23/2017 but due to pyelonephritis the surgery will be rescheduled.  Urology to continue to follow  Nicolette Bang 03/21/2017, 1:36 PM

## 2017-03-21 NOTE — H&P (Signed)
History and Physical    Laurie Wise JKD:[id number] DOB: 02-17-1956 DOA: 03/21/2017  PCP: System, Pcp Not In  Patient coming from: Home  I have personally briefly reviewed patient's old medical records in [place]  Chief Complaint: Fever  HPI: Laurie Wise is a 61 y.o. female with medical history significant of ALS, left ureteral stone, had stent placed a couple of weeks ago.  Was on ABx prophylaxis up until 3-4 days ago or so.  Since she completed this however has developed fever, chills, dysuria.  Called urology on call and got sent in to ED this evening.   ED Course: UA confirms UTI.  Patient started on rocephin.   Review of Systems: As per HPI otherwise 10 point review of systems negative.   Past Medical History:  Diagnosis Date  . Ductal carcinoma in situ (DCIS) of left breast  [facility] in [place]--- per pt no recurrence   dx s/p  left breast lumpectomy and radiation  . History of acute pyelonephritis 03/06/2017   w/ sepsis due to ureteral stone obstruction  . History of radiation therapy 2016   left breast   . Left ureteral stone   . 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 Nephrolithiasis    bilateral non-obstructive per CT 03-06-2017  . OSA on CPAP    setting 8  . Sensation of pressure in bladder area   . Tachycardia    taking beta blocker  . Uses walker   . Wears glasses     Past Surgical History:  Procedure Laterality Date  . BREAST LUMPECTOMY Left 2016   [facility] in [place]   DCIS  . CESAREAN SECTION  1990  . CYSTOSCOPY WITH STENT PLACEMENT Right 03/06/2017   Procedure: CYSTOSCOPY WITH RIGHT STENT PLACEMENT;  Surgeon: Franchot Gallo, MD;  Location: [facility];  Service: Urology;  Laterality: Right;  . LAMINECTOMY  2003   L4-5  . PORTA CATH INSERTION  08/2016   power port for ALS IV medication     reports that she quit smoking about 46 years ago. Her smoking use included Cigarettes. She quit after 10.00 years of use. She has never  used smokeless tobacco. She reports that she does not drink alcohol or use drugs.  Allergies  Allergen Reactions  . Adhesive [Tape] Other (See Comments)    Blisters     Family History  Problem Relation Age of Onset  . Parkinson's disease Mother   . Renal cancer Father   . CAD Father   . Diabetes Other   . Stroke Neg Hx   . Clotting disorder Neg Hx      Prior to Admission medications   Medication Sig Start Date End Date Taking? Authorizing Provider  acetaminophen (TYLENOL) 500 MG tablet Take 1,000 mg by mouth every 6 (six) hours as needed for mild pain, moderate pain, fever or headache.   Yes [provider]  B Complex Vitamins (B COMPLEX 50) TABS Take 2 tablets by mouth daily.   Yes [provider]  Bioflavonoid Products (ESTER-C) TABS Take 2 tablets by mouth daily.   Yes [provider]  Cholecalciferol (VITAMIN D PO) Take 1 tablet by mouth daily.   Yes [provider]  Cyanocobalamin (VITAMIN B-12 SL) Place 1 tablet under the tongue daily.   Yes [provider]  Edaravone (RADICAVA) 30 MG/100ML SOLN Inject 30 mg into the vein See admin instructions. 10 days on and 18 days off [facility] = Mingo Amber, RN  [place], [place]  Currently Sunday will be off days starting Sunday 03-19-2017   Yes [provider]  Fish Oil-Cholecalciferol (OMEGA-3 + D PO) Take 5 mLs by mouth daily.   Yes [provider]  ibuprofen (ADVIL,MOTRIN) 200 MG tablet Take 600-800 mg by mouth every 6 (six) hours as needed for fever, headache, mild pain, moderate pain or cramping.   Yes [provider]  IRON PO Take 4 tablets by mouth daily. OTC   Yes [provider]  metoprolol tartrate (LOPRESSOR) 25 MG tablet Take 0.5 tablets (12.5 mg total) by mouth 2 (two) times daily. Take if having worsening symptoms of tremors, palpitations. 03/08/17  Yes Mariel Aloe, MD  Multiple Vitamin (MULTIVITAMIN WITH MINERALS) TABS tablet Take  1 tablet by mouth 2 (two) times daily.    Yes [provider]  OVER THE COUNTER MEDICATION 1 scoop daily. *Trehalose Power* = sweetner   Yes [provider]  oxybutynin (DITROPAN) 5 MG tablet Take 1 tablet (5 mg total) by mouth every 8 (eight) hours as needed for bladder spasms. 03/08/17  Yes Mariel Aloe, MD  Probiotic CAPS Take 1 capsule by mouth daily.   Yes [provider]  RILUZOLE PO Take 1 tablet by mouth 2 (two) times daily.    Yes [provider]  tamoxifen (NOLVADEX) 10 MG tablet Take 10 mg by mouth 2 (two) times daily.    Yes [provider]  TURMERIC PO Take 2 tablets by mouth daily.   Yes [provider]  Venlafaxine HCl 150 MG TB24 Take 150 mg by mouth every morning.    Yes [provider]  vitamin E 400 UNIT capsule Take 1,200 Units by mouth daily.   Yes [provider]    Physical Exam: Vitals:   03/20/17 2221 03/21/17 0013  BP: 132/71 123/69  Pulse: (!) 117 (!) 106  Resp: 16 14  Temp: 99.3 F (37.4 C) 99.1 F (37.3 C)  TempSrc: Oral Oral  SpO2: 94% 95%  Weight: 89.8 kg (198 lb)   Height: 5\' 6"  (1.676 m)     Constitutional: NAD, calm, comfortable Eyes: PERRL, lids and conjunctivae normal ENMT: Mucous membranes are moist. Posterior pharynx clear of any exudate or lesions.Normal dentition.  Neck: normal, supple, no masses, no thyromegaly Respiratory: clear to auscultation bilaterally, no wheezing, no crackles. Normal respiratory effort. No accessory muscle use.  Cardiovascular: Regular rate and rhythm, no murmurs / rubs / gallops. No extremity edema. 2+ pedal pulses. No carotid bruits.  Abdomen: no tenderness, no masses palpated. No hepatosplenomegaly. Bowel sounds positive.  Musculoskeletal: no clubbing / cyanosis. No joint deformity upper and lower extremities. Good ROM, no contractures. Normal muscle tone.  Skin: no rashes, lesions, ulcers. No induration Neurologic: CN 2-12 grossly intact.  Sensation intact, DTR normal. Strength 5/5 in all 4.  Psychiatric: Normal judgment and insight. Alert and oriented x 3. Normal mood.    Labs on Admission: I have personally reviewed following labs and imaging studies  CBC:  Recent Labs Lab 03/21/17 0115  WBC 14.8*  NEUTROABS 11.8*  HGB 12.3  HCT 37.2  MCV 89.0  PLT 644   Basic Metabolic Panel:  Recent Labs Lab 03/21/17 0115  NA 138  K 3.7  CL 105  CO2 26  GLUCOSE 100*  BUN 13  CREATININE 0.44  CALCIUM 9.1   GFR: Estimated Creatinine Clearance: 83.4 mL/min (by C-G formula based on SCr of 0.44 mg/dL). Liver Function Tests: No results for input(s):  AST, ALT, ALKPHOS, BILITOT, PROT, ALBUMIN in the last 168 hours. No results for input(s): LIPASE, AMYLASE in the last 168 hours. No results for input(s): AMMONIA in the last 168 hours. Coagulation Profile: No results for input(s): INR, PROTIME in the last 168 hours. Cardiac Enzymes: No results for input(s): CKTOTAL, CKMB, CKMBINDEX, TROPONINI in the last 168 hours. BNP (last 3 results) No results for input(s): PROBNP in the last 8760 hours. HbA1C: No results for input(s): HGBA1C in the last 72 hours. CBG: No results for input(s): GLUCAP in the last 168 hours. Lipid Profile: No results for input(s): CHOL, HDL, LDLCALC, TRIG, CHOLHDL, LDLDIRECT in the last 72 hours. Thyroid Function Tests: No results for input(s): TSH, T4TOTAL, FREET4, T3FREE, THYROIDAB in the last 72 hours. Anemia Panel: No results for input(s): VITAMINB12, FOLATE, FERRITIN, TIBC, IRON, RETICCTPCT in the last 72 hours. Urine analysis:    Component Value Date/Time   COLORURINE YELLOW 03/21/2017 0105   APPEARANCEUR CLOUDY (A) 03/21/2017 0105   LABSPEC 1.013 03/21/2017 0105   PHURINE 5.0 03/21/2017 0105   GLUCOSEU NEGATIVE 03/21/2017 0105   HGBUR LARGE (A) 03/21/2017 0105   BILIRUBINUR NEGATIVE 03/21/2017 0105   KETONESUR NEGATIVE 03/21/2017 0105   PROTEINUR 100 (A) 03/21/2017 0105   NITRITE  POSITIVE (A) 03/21/2017 0105   LEUKOCYTESUR LARGE (A) 03/21/2017 0105    Radiological Exams on Admission: No results found.  EKG: Independently reviewed.  Assessment/Plan Principal Problem:   UTI (urinary tract infection) Active Problems:   ALS (amyotrophic lateral sclerosis) (HCC)    1. UTI - 1. Rocephin 2. Culture pending 3. Urology to see in AM 4. Has ureteral stent in place 2. ALS - 1. Continue home meds 2. Riluzole apparently can cause UTIs in 3% 3. But dont want to stop this at this time since prognosis of untreated ALS is typically very poor.  DVT prophylaxis: Lovenox Code Status: Full Family Communication: Family at bedside Disposition Plan: Home after admit Consults called: Urology, EDP spoke with Dr. Alyson Ingles Admission status: Place in [place], [place] [facility] Pager [phone]  If 7AM-7PM, please contact day team taking care of patient [email or web address] Password [email or web address]  03/21/2017, 2:33 AM

## 2017-03-21 NOTE — ED Notes (Signed)
Bed: [facility] Expected date:  Expected time:  Means of arrival:  Comments: 

## 2017-03-21 NOTE — ED Notes (Signed)
RN not available to take report at the scheduled time. Call back information left with unit secretary.

## 2017-03-21 NOTE — ED Provider Notes (Signed)
[facility] DEPT Provider Note   CSN: [id number] Arrival date & time: 03/20/17  2216     History   Chief Complaint Chief Complaint  Patient presents with  . Fever    recent rt j stint for stones    HPI Laurie Wise is a 61 y.o. female.  Patient is a 61 year old female with history of ALS and recently diagnosed renal calculus requiring stent. She presents today for evaluation of fever. This evening she felt chilled, then took her temperature and noted it was 100.5. She spoke with the on-call urologist to recommend she come in to be evaluated. She does report some pressure when urinating but denies any abdominal pain. She denies any other explanation for her fever such as sore throat, cough, congestion, diarrhea.   The history is provided by the patient.  Fever   This is a new problem. The current episode started 3 to 5 hours ago. The problem occurs constantly. The problem has been gradually improving. The maximum temperature noted was 100 to 100.9 F. Pertinent negatives include no chest pain, no diarrhea, no vomiting, no congestion, no sore throat and no cough. She has tried nothing for the symptoms.    Past Medical History:  Diagnosis Date  . Ductal carcinoma in situ (DCIS) of left breast  [facility] in [place]--- per pt no recurrence   dx s/p  left breast lumpectomy and radiation  . History of acute pyelonephritis 03/06/2017   w/ sepsis due to ureteral stone obstruction  . History of radiation therapy 2016   left breast   . Left ureteral stone   . 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 Nephrolithiasis    bilateral non-obstructive per CT 03-06-2017  . OSA on CPAP    setting 8  . Sensation of pressure in bladder area   . Tachycardia    taking beta blocker  . Uses walker   . Wears glasses     Patient Active Problem List   Diagnosis Date Noted  . Pyonephrosis 03/06/2017  . Sepsis ([place]) 03/06/2017  . ALS (amyotrophic lateral sclerosis) ([place]) 03/06/2017    Past  Surgical History:  Procedure Laterality Date  . BREAST LUMPECTOMY Left 2016   [facility] in [place]   DCIS  . CESAREAN SECTION  1990  . CYSTOSCOPY WITH STENT PLACEMENT Right 03/06/2017   Procedure: CYSTOSCOPY WITH RIGHT STENT PLACEMENT;  Surgeon: Franchot Gallo, MD;  [facility];  Service: Urology;  Laterality: Right;  . LAMINECTOMY  2003   L4-5  . PORTA CATH INSERTION  08/2016   power port for ALS IV medication    OB History    No data available       Home Medications    Prior to Admission medications   Medication Sig Start Date End Date Taking? Authorizing Provider  acetaminophen (TYLENOL) 500 MG tablet Take 1,000 mg by mouth every 6 (six) hours as needed for mild pain, moderate pain, fever or headache.    [provider]  B Complex Vitamins (B COMPLEX 50) TABS Take 2 tablets by mouth daily.    [provider]  Bioflavonoid Products (ESTER-C) TABS Take 2 tablets by mouth daily.    [provider]  Cholecalciferol (VITAMIN D PO) Take 1 tablet by mouth daily.    [provider]  Cyanocobalamin (VITAMIN B-12 SL) Place 1 tablet under the tongue daily.    [provider]  Edaravone (RADICAVA) 30 MG/100ML SOLN Inject 30 mg into the vein  See admin instructions. 10 days on and 18 days off [facility] = Mingo Amber, RN [phone] [place], [place]  Currently Sunday will be off days starting Sunday 03-19-2017    [provider]  Fish Oil-Cholecalciferol (OMEGA-3 + D PO) Take 5 mLs by mouth daily.    [provider]  ibuprofen (ADVIL,MOTRIN) 200 MG tablet Take 600-800 mg by mouth every 6 (six) hours as needed for fever, headache, mild pain, moderate pain or cramping.    [provider]  IRON PO Take 4 tablets by mouth daily. OTC    [provider]  metoprolol tartrate (LOPRESSOR) 25 MG tablet Take 0.5 tablets (12.5 mg total) by mouth 2 (two) times daily. Take if having worsening symptoms of  tremors, palpitations. 03/08/17   Mariel Aloe, MD  Multiple Vitamin (MULTIVITAMIN WITH MINERALS) TABS tablet Take 1 tablet by mouth 2 (two) times daily.     [provider]  OVER THE COUNTER MEDICATION 1 scoop daily. *Trehalose Power* = sweetner    [provider]  oxybutynin (DITROPAN) 5 MG tablet Take 1 tablet (5 mg total) by mouth every 8 (eight) hours as needed for bladder spasms. 03/08/17   Mariel Aloe, MD  Probiotic CAPS Take 1 capsule by mouth daily.    [provider]  RILUZOLE PO Take 1 tablet by mouth 2 (two) times daily.     [provider]  tamoxifen (NOLVADEX) 10 MG tablet Take 10 mg by mouth 2 (two) times daily.     [provider]  TURMERIC PO Take 2 tablets by mouth daily.    [provider]  Venlafaxine HCl 150 MG TB24 Take 150 mg by mouth every morning.     [provider]  vitamin E 400 UNIT capsule Take 1,200 Units by mouth daily.    [provider]    Family History Family History  Problem Relation Age of Onset  . Parkinson's disease Mother   . Renal cancer Father   . CAD Father   . Diabetes Other   . Stroke Neg Hx   . Clotting disorder Neg Hx     Social History Social History  Substance Use Topics  . Smoking status: Former Smoker    Years: 10.00    Types: Cigarettes    Quit date: 03/18/1971  . Smokeless tobacco: Never Used  . Alcohol use No     Allergies   Adhesive [tape]   Review of Systems Review of Systems  Constitutional: Positive for fever.  HENT: Negative for congestion and sore throat.   Respiratory: Negative for cough.   Cardiovascular: Negative for chest pain.  Gastrointestinal: Negative for diarrhea and vomiting.  All other systems reviewed and are negative.    Physical Exam Updated Vital Signs BP 123/69 (BP Location: Left Arm)   Pulse (!) 106   Temp 99.1 F (37.3 C) (Oral)   Resp 14   Ht 5\' 6"  (1.676 m)   Wt 89.8 kg (198 lb)   SpO2 95%   BMI 31.96  kg/m   Physical Exam  Constitutional: She is oriented to person, place, and time. She appears well-developed and well-nourished. No distress.  HENT:  Head: Normocephalic and atraumatic.  Mouth/Throat: Oropharynx is clear and moist.  Neck: Normal range of motion. Neck supple.  Cardiovascular: Normal rate and regular rhythm.  Exam reveals no gallop and no friction rub.   No murmur heard. Pulmonary/Chest: Effort normal and breath sounds normal. No respiratory distress. She has no wheezes.  She has no rales.  Abdominal: Soft. Bowel sounds are normal. She exhibits no distension. There is no tenderness.  There is no abdominal or CVA tenderness.  Musculoskeletal: Normal range of motion.  Neurological: She is alert and oriented to person, place, and time.  Skin: Skin is warm and dry. She is not diaphoretic.  Nursing note and vitals reviewed.    ED Treatments / Results  Labs (all labs ordered are listed, but only abnormal results are displayed) Labs Reviewed  BASIC METABOLIC PANEL  CBC WITH DIFFERENTIAL/PLATELET  URINALYSIS, ROUTINE W REFLEX MICROSCOPIC  I-STAT CG4 LACTIC ACID, ED    EKG  EKG Interpretation None       Radiology No results found.  Procedures Procedures (including critical care time)  Medications Ordered in ED Medications - No data to display   Initial Impression / Assessment and Plan / ED Course  I have reviewed the triage vital signs and the nursing notes.  Pertinent labs & imaging results that were available during my care of the patient were reviewed by me and considered in my medical decision making (see chart for details).  Patient with complaints of fever at home. She is status post ureteral stent placement 2 weeks ago and has a history of ALS. She was instructed by the urologist to come here for evaluation. She is afebrile upon presentation and lactate is normal, however she does have an elevated white count of 15,000 and convincing evidence of a UTI  on her urinalysis. I have discussed these findings with Dr. Noah Delaine from urology. He is recommending urine culture, IV antibiotics, and admission for further observation. I've spoken with Dr. Alcario Drought who agrees to admit.  Final Clinical Impressions(s) / ED Diagnoses   Final diagnoses:  None    New Prescriptions New Prescriptions   No medications on file     Veryl Speak, MD 03/21/17 [phone]

## 2017-03-21 NOTE — ED Notes (Signed)
ED Nurse can call report to Joellen Jersey, RN at [phone] at [phone]. Thank you!

## 2017-03-21 NOTE — Progress Notes (Signed)
Patient admitted after midnight. For details please refer to admission note done 03/21/2017.  Pt's husband at the bedside,. Pt seen and examined at the bedside.   61 year old female with history of ALS, left ureteral stone, had a stent placed a few weeks prior to this admission. She was on antibiotic prophylaxis up until 3-4 days ago. Since she completed the antibiotics she started to develop fever, chills and dysuria. Urology will see the patient in consultation. Patient was started on empiric Rocephin for UTI.  Leisa Lenz [facility] [phone]

## 2017-03-22 DIAGNOSIS — N390 Urinary tract infection, site not specified: Secondary | ICD-10-CM

## 2017-03-22 LAB — CBC WITH DIFFERENTIAL/PLATELET
BASOS ABS: 0.1 10*3/uL (ref 0.0–0.1)
Basophils Relative: 1 %
Eosinophils Absolute: 0.2 10*3/uL (ref 0.0–0.7)
Eosinophils Relative: 2 %
HEMATOCRIT: 37.7 % (ref 36.0–46.0)
Hemoglobin: 12.4 g/dL (ref 12.0–15.0)
LYMPHS PCT: 22 %
Lymphs Abs: 2 10*3/uL (ref 0.7–4.0)
MCH: 29 pg (ref 26.0–34.0)
MCHC: 32.9 g/dL (ref 30.0–36.0)
MCV: 88.3 fL (ref 78.0–100.0)
Monocytes Absolute: 0.7 10*3/uL (ref 0.1–1.0)
Monocytes Relative: 7 %
NEUTROS ABS: 6.3 10*3/uL (ref 1.7–7.7)
Neutrophils Relative %: 68 %
PLATELETS: 266 10*3/uL (ref 150–400)
RBC: 4.27 MIL/uL (ref 3.87–5.11)
RDW: 13.8 % (ref 11.5–15.5)
WBC: 9.3 10*3/uL (ref 4.0–10.5)

## 2017-03-22 LAB — BASIC METABOLIC PANEL
ANION GAP: 7 (ref 5–15)
BUN: 10 mg/dL (ref 6–20)
CALCIUM: 9 mg/dL (ref 8.9–10.3)
CO2: 26 mmol/L (ref 22–32)
Chloride: 105 mmol/L (ref 101–111)
Creatinine, Ser: 0.39 mg/dL — ABNORMAL LOW (ref 0.44–1.00)
GFR calc Af Amer: >60 mL/min (ref >60)
GFR calc non Af Amer: >60 mL/min (ref >60)
GLUCOSE: 97 mg/dL (ref 65–99)
Potassium: 4 mmol/L (ref 3.5–5.1)
Sodium: 138 mmol/L (ref 135–145)

## 2017-03-22 MED ORDER — SACCHAROMYCES BOULARDII 250 MG PO CAPS
250.0000 mg | ORAL_CAPSULE | Freq: Two times a day (BID) | ORAL | Status: DC
  Administered 2017-03-22 – 2017-03-23 (×2): 250 mg via ORAL
  Filled 2017-03-22 (×3): qty 1

## 2017-03-22 MED ORDER — EDARAVONE 30 MG/100ML IV SOLN: 60.0000 mg | Freq: Every day | INTRAVENOUS | Status: DC

## 2017-03-22 NOTE — Progress Notes (Addendum)
PROGRESS NOTE    Laurie Wise  ZLD:[id number] DOB: [birth year] DOA: 03/21/2017 PCP: System, Pcp Not In   Brief Narrative: 61 year old female with a history of ALS but very high functioning with history of nephrocalcinosis admitted with urinary tract infection. Patient had a right ureteral stent placed for sepsis on 03/06/2017. She developed fever once her antibiotics were completed. Her last urinary tract infection prior to this episode was last year. Today she feels well. She was due to have stone extraction done tomorrow. She is hoping that it'll still be done tomorrow. I reviewed the urology notes and appreciated their input. Stone extraction has been rescheduled because of current urinary tract infection. Urine culture is pending at this time. Patient is on IV Rocephin once a day.   Assessment & Plan:   Principal Problem:   UTI (urinary tract infection) Active Problems:   ALS (amyotrophic lateral sclerosis) (HCC)  Urinary tract infection in a patient with history of renal calcinosis nephrolithiasis history of ALS status post right ureteral stent placement. Continue Rocephin follow-up urine final urine culture. Adjust the antibiotics as needed.  Hypertension continue metoprolol.  ALS continue rilutec.  History of breast carcinoma on tamoxifen.  Leukocytosis follow-up labs today.     DVT prophylaxis: lovenox Code Status: full Family Communication:  Disposition Plan   Consultants urology  Procedures:   Antimicrobials:    Subjective:   Objective: Vitals:   03/21/17 0432 03/21/17 1425 03/21/17 2126 03/22/17 0503  BP:  128/68 130/68 140/77  Pulse:  95 (!) 108 (!) 105  Resp:   16 15  Temp:  98.3 F (36.8 C) 98.4 F (36.9 C) 98.4 F (36.9 C)  TempSrc:  Oral Oral Oral  SpO2:  98% 95% 95%  Weight: 89.9 kg (198 lb 3.2 oz)     Height:        Intake/Output Summary (Last 24 hours) at 03/22/17 1212 Last data filed at 03/22/17 0503  Gross per 24 hour  Intake               120 ml  Output             2100 ml  Net            -1980 ml   Filed Weights   03/20/17 2221 03/21/17 0432  Weight: 89.8 kg (198 lb) 89.9 kg (198 lb 3.2 oz)    Examination:  General exam: Appears calm and comfortable  Respiratory system: Clear to auscultation. Respiratory effort normal. Cardiovascular system: S1 & S2 heard, RRR. No JVD, murmurs, rubs, gallops or clicks. No pedal edema. Gastrointestinal system: Abdomen is nondistended, soft and nontender. No organomegaly or masses felt. Normal bowel sounds heard. Central nervous system: Alert and oriented. No focal neurological deficits. Extremities: Symmetric 5 x 5 power. Skin: No rashes, lesions or ulcers Psychiatry: Judgement and insight appear normal. Mood & affect appropriate.     Data Reviewed: I have personally reviewed following labs and imaging studies  CBC:  Recent Labs Lab 03/21/17 0115  WBC 14.8*  NEUTROABS 11.8*  HGB 12.3  HCT 37.2  MCV 89.0  PLT 903   Basic Metabolic Panel:  Recent Labs Lab 03/21/17 0115  NA 138  K 3.7  CL 105  CO2 26  GLUCOSE 100*  BUN 13  CREATININE 0.44  CALCIUM 9.1   GFR: Estimated Creatinine Clearance: 83.4 mL/min (by C-G formula based on SCr of 0.44 mg/dL). Liver Function Tests: No results for input(s): AST, ALT, ALKPHOS, BILITOT, PROT, ALBUMIN in  the last 168 hours. No results for input(s): LIPASE, AMYLASE in the last 168 hours. No results for input(s): AMMONIA in the last 168 hours. Coagulation Profile: No results for input(s): INR, PROTIME in the last 168 hours. Cardiac Enzymes: No results for input(s): CKTOTAL, CKMB, CKMBINDEX, TROPONINI in the last 168 hours. BNP (last 3 results) No results for input(s): PROBNP in the last 8760 hours. HbA1C: No results for input(s): HGBA1C in the last 72 hours. CBG: No results for input(s): GLUCAP in the last 168 hours. Lipid Profile: No results for input(s): CHOL, HDL, LDLCALC, TRIG, CHOLHDL, LDLDIRECT in the last 72  hours. Thyroid Function Tests: No results for input(s): TSH, T4TOTAL, FREET4, T3FREE, THYROIDAB in the last 72 hours. Anemia Panel: No results for input(s): VITAMINB12, FOLATE, FERRITIN, TIBC, IRON, RETICCTPCT in the last 72 hours. Sepsis Labs:  Recent Labs Lab 03/21/17 0127  LATICACIDVEN 0.55    Recent Results (from the past 240 hour(s))  Urine culture     Status: Abnormal (Preliminary result)   Collection Time: 03/21/17  1:05 AM  Result Value Ref Range Status   Specimen Description URINE, CLEAN CATCH  Final   Special Requests NONE  Final   Culture >=100,000 COLONIES/mL GRAM NEGATIVE RODS (A)  Final   Report Status PENDING  Incomplete         Radiology Studies: Dg Chest 2 View  Result Date: 03/21/2017 CLINICAL DATA:  Fever and chest pain. EXAM: CHEST  2 VIEW COMPARISON:  None. FINDINGS: Right IJ power port in good position without complicating features. The tip is at the mid SVC. The heart is normal in size. The mediastinal and hilar contours appear normal. Surgical changes are noted in the left breast. There are streaky areas of scarring change or subsegmental atelectasis at the lung bases, right greater than left. No definite pleural effusions or focal infiltrates. No pulmonary edema. The bony thorax is intact. IMPRESSION: Basilar scarring changes versus subsegmental atelectasis. No definite infiltrates or effusions. Electronically Signed   By: Marijo Sanes M.D.   On: 03/21/2017 08:19        Scheduled Meds: . B-complex with vitamin C  2 tablet Oral Daily  . [START ON 04/08/2017] Edaravone  60 mg Intravenous Daily  . enoxaparin (LOVENOX) injection  40 mg Subcutaneous Q24H  . metoprolol tartrate  12.5 mg Oral BID  . multivitamin with minerals  1 tablet Oral BID  . riluzole  50 mg Oral BID  . tamoxifen  10 mg Oral BID  . venlafaxine XR  150 mg Oral Daily  . vitamin E  1,200 Units Oral Daily   Continuous Infusions: . cefTRIAXone (ROCEPHIN)  IV Stopped (03/21/17 2245)      LOS: 1 day    Time spent:     Georgette Shell, MD [facility]  If 7PM-7AM, please contact night-coverage [email or web address] Password [email or web address] 03/22/2017, 12:12 PM

## 2017-03-23 ENCOUNTER — Other Ambulatory Visit: Payer: Self-pay | Admitting: Urology

## 2017-03-23 ENCOUNTER — Ambulatory Visit (HOSPITAL_BASED_OUTPATIENT_CLINIC_OR_DEPARTMENT_OTHER): Admit: 2017-03-23 | Payer: Medicare Other | Admitting: Urology

## 2017-03-23 DIAGNOSIS — N390 Urinary tract infection, site not specified: Secondary | ICD-10-CM | POA: Diagnosis not present

## 2017-03-23 DIAGNOSIS — R309 Painful micturition, unspecified: Secondary | ICD-10-CM | POA: Diagnosis not present

## 2017-03-23 HISTORY — DX: Other symptoms and signs involving the genitourinary system: R39.89

## 2017-03-23 HISTORY — DX: Calculus of ureter: N20.1

## 2017-03-23 HISTORY — DX: Calculus of kidney: N20.0

## 2017-03-23 HISTORY — DX: Personal history of urinary (tract) infections: Z87.440

## 2017-03-23 HISTORY — DX: Presence of spectacles and contact lenses: Z97.3

## 2017-03-23 HISTORY — DX: Obstructive sleep apnea (adult) (pediatric): G47.33

## 2017-03-23 HISTORY — DX: Tachycardia, unspecified: R00.0

## 2017-03-23 HISTORY — DX: Dependence on other enabling machines and devices: Z99.89

## 2017-03-23 HISTORY — DX: Intraductal carcinoma in situ of left breast: D05.12

## 2017-03-23 HISTORY — DX: Personal history of irradiation: Z92.3

## 2017-03-23 LAB — URINE CULTURE: Culture: >=100000 — AB

## 2017-03-23 SURGERY — CYSTOSCOPY/URETEROSCOPY/HOLMIUM LASER/STENT PLACEMENT
Anesthesia: General | Laterality: Right

## 2017-03-23 MED ORDER — HEPARIN SOD (PORK) LOCK FLUSH 100 UNIT/ML IV SOLN
500.0000 [IU] | INTRAVENOUS | Status: AC | PRN
  Administered 2017-03-23: 500 [IU]

## 2017-03-23 MED ORDER — CIPROFLOXACIN HCL 500 MG PO TABS: 500.0000 mg | ORAL_TABLET | Freq: Two times a day (BID) | ORAL | Status: DC

## 2017-03-23 MED ORDER — CIPROFLOXACIN HCL 500 MG PO TABS: 500.0000 mg | ORAL_TABLET | Freq: Two times a day (BID) | ORAL | 0 refills | Status: AC

## 2017-03-23 MED ORDER — CIPROFLOXACIN HCL 500 MG PO TABS: 500.0000 mg | ORAL_TABLET | Freq: Two times a day (BID) | ORAL | 0 refills | Status: DC

## 2017-03-23 NOTE — Progress Notes (Signed)
Patient given discharge, follow up, and medication instructions, verbalized understanding, telemetry removed, IV Team RN deaccessed port, personal belongings with patient, family to transport home

## 2017-03-23 NOTE — Progress Notes (Signed)
  Subjective: Patient reports feeling much better.  Objective: Vital signs in last 24 hours: Temp:  [98.1 F (36.7 C)-98.5 F (36.9 C)] 98.3 F (36.8 C) (09/06 0554) Pulse Rate:  [90-113] 104 (09/06 0957) Resp:  [16-18] 16 (09/06 0554) BP: (132-140)/(74-88) 140/88 (09/06 0957) SpO2:  [95 %-98 %] 95 % (09/06 0554)  Intake/Output from previous day: 09/05 0701 - 09/06 0700 In: 580 [P.O.:480; IV Piggyback:100] Out: 1600 [Urine:1600] Intake/Output this shift: No intake/output data recorded.  Physical Exam:  Constitutional: Vital signs reviewed. WD WN in NAD   Eyes: PERRL, No scleral icterus.   Pulmonary/Chest: Normal effort Extremities: No cyanosis or edema   Lab Results:  Recent Labs  03/21/17 0115 03/22/17 1307  HGB 12.3 12.4  HCT 37.2 37.7   BMET  Recent Labs  03/21/17 0115 03/22/17 1307  NA 138 138  K 3.7 4.0  CL 105 105  CO2 26 26  GLUCOSE 100* 97  BUN 13 10  CREATININE 0.44 0.39*  CALCIUM 9.1 9.0   No results for input(s): LABPT, INR in the last 72 hours. No results for input(s): LABURIN in the last 72 hours. Results for orders placed or performed during the hospital encounter of 03/21/17  Urine culture     Status: Abnormal   Collection Time: 03/21/17  1:05 AM  Result Value Ref Range Status   Specimen Description URINE, CLEAN CATCH  Final   Special Requests NONE  Final   Culture >=100,000 COLONIES/mL ESCHERICHIA COLI (A)  Final   Report Status 03/23/2017 FINAL  Final   Organism ID, Bacteria ESCHERICHIA COLI (A)  Final      Susceptibility   Escherichia coli - MIC*    AMPICILLIN <=2 SENSITIVE Sensitive     CEFAZOLIN <=4 SENSITIVE Sensitive     CEFTRIAXONE <=1 SENSITIVE Sensitive     CIPROFLOXACIN <=0.25 SENSITIVE Sensitive     GENTAMICIN <=1 SENSITIVE Sensitive     IMIPENEM <=0.25 SENSITIVE Sensitive     NITROFURANTOIN <=16 SENSITIVE Sensitive     TRIMETH/SULFA <=20 SENSITIVE Sensitive     AMPICILLIN/SULBACTAM <=2 SENSITIVE Sensitive    PIP/TAZO <=4 SENSITIVE Sensitive     Extended ESBL NEGATIVE Sensitive     * >=100,000 COLONIES/mL ESCHERICHIA COLI    Studies/Results: No results found.  Assessment/Plan:   Recent UTI postponing URS scheduled for today. We'll reschedule procedure w/in 2 week course of her abx.   LOS: 2 days   Franchot Gallo M 03/23/2017, 10:55 AM

## 2017-03-23 NOTE — Discharge Summary (Signed)
Physician Discharge Summary  Laurie Wise VEH:[id number] DOB: [birth year] DOA: 03/21/2017  PCP: System, Pcp Not In  Admit date: 03/21/2017 Discharge date: 03/23/2017  Admitted from home Disposition:  home  Recommendations for Outpatient Follow-up:  1. Follow up with PCP in 1-2 weeks 2. Please obtain BMP/CBC in one week 3. Please follow up on the following pending results:  Home Health:no Equipment/Devices:  Discharge Condition:stable CODE STATUS:full Diet recommendation: regular  Brief/Interim Summary:61 yo female admitted with complicated uti urine culture grew ecoli sensitive to ciprofloxacin.patient to follow up with urologist for stone extraction.   Discharge Diagnoses:  Principal Problem:   UTI (urinary tract infection) Active Problems:   ALS (amyotrophic lateral sclerosis) (HCC)  ciiprofloxacin 500 mg 2 weeks.  Discharge Instructions Follow up with urologist and pcp.  Allergies as of 03/23/2017      Reactions   Adhesive [tape] Other (See Comments)   Blisters       Medication List    STOP taking these medications   ibuprofen 200 MG tablet Commonly known as:  ADVIL,MOTRIN   OVER THE COUNTER MEDICATION     TAKE these medications   acetaminophen 500 MG tablet Commonly known as:  TYLENOL Take 1,000 mg by mouth every 6 (six) hours as needed for mild pain, moderate pain, fever or headache.   B COMPLEX 50 Tabs Take 2 tablets by mouth daily.   ciprofloxacin 500 MG tablet Commonly known as:  CIPRO Take 1 tablet (500 mg total) by mouth 2 (two) times daily.   ciprofloxacin 500 MG tablet Commonly known as:  CIPRO Take 1 tablet (500 mg total) by mouth 2 (two) times daily.   ESTER-C Tabs Take 2 tablets by mouth daily.   IRON PO Take 4 tablets by mouth daily. OTC   metoprolol tartrate 25 MG tablet Commonly known as:  LOPRESSOR Take 0.5 tablets (12.5 mg total) by mouth 2 (two) times daily. Take if having worsening symptoms of tremors, palpitations.   multivitamin  with minerals Tabs tablet Take 1 tablet by mouth 2 (two) times daily.   OMEGA-3 + D PO Take 5 mLs by mouth daily.   oxybutynin 5 MG tablet Commonly known as:  DITROPAN Take 1 tablet (5 mg total) by mouth every 8 (eight) hours as needed for bladder spasms.   Probiotic Caps Take 1 capsule by mouth daily.   RADICAVA 30 MG/100ML Soln Generic drug:  Edaravone Inject 60 mg into the vein See admin instructions. 10 days on and 18 days off [facility] = Mingo Amber, RN [phone] [place], [place]  Currently Sunday will be off days starting Sunday 03-19-2017   RILUZOLE PO Take 1 tablet by mouth 2 (two) times daily.   tamoxifen 10 MG tablet Commonly known as:  NOLVADEX Take 10 mg by mouth 2 (two) times daily.   TURMERIC PO Take 2 tablets by mouth daily.   Venlafaxine HCl 150 MG Tb24 Take 150 mg by mouth every morning.   VITAMIN B-12 SL Place 1 tablet under the tongue daily.   VITAMIN D PO Take 1 tablet by mouth daily.   vitamin E 400 UNIT capsule Take 1,200 Units by mouth daily.            Discharge Care Instructions        Start     Ordered   03/23/17 0000  ciprofloxacin (CIPRO) 500 MG tablet  2 times daily     09 /06/18 1036   03/23/17 0000  ciprofloxacin (CIPRO) 500 MG tablet  2 times daily  03/23/17 1044      Allergies  Allergen Reactions  . Adhesive [Tape] Other (See Comments)    Blisters     Consultations:urology   Procedures/Studies: Dg Chest 2 View  Result Date: 03/21/2017 CLINICAL DATA:  Fever and chest pain. EXAM: CHEST  2 VIEW COMPARISON:  None. FINDINGS: Right IJ power port in good position without complicating features. The tip is at the mid SVC. The heart is normal in size. The mediastinal and hilar contours appear normal. Surgical changes are noted in the left breast. There are streaky areas of scarring change or subsegmental atelectasis at the lung bases, right greater than left. No definite pleural effusions or focal infiltrates. No pulmonary  edema. The bony thorax is intact. IMPRESSION: Basilar scarring changes versus subsegmental atelectasis. No definite infiltrates or effusions. Electronically Signed   By: Marijo Sanes M.D.   On: 03/21/2017 08:19   Ct Abdomen Pelvis W Contrast  Result Date: 03/06/2017 CLINICAL DATA:  Abdominal and pelvic pain since Friday. Hematuria. Diarrhea. Pyelonephritis. EXAM: CT ABDOMEN AND PELVIS WITH CONTRAST TECHNIQUE: Multidetector CT imaging of the abdomen and pelvis was performed using the standard protocol following bolus administration of intravenous contrast. CONTRAST:  165mL ISOVUE-300 IOPAMIDOL (ISOVUE-300) INJECTION 61% COMPARISON:  None. FINDINGS: Lower chest: Subsegmental atelectasis at the lung bases. Mild cardiomegaly. Incompletely imaged central line. Hepatobiliary: Focal steatosis adjacent the falciform ligament. Normal gallbladder, without biliary ductal dilatation. Pancreas: Normal, without mass or ductal dilatation. Spleen: Normal in size, without focal abnormality. Adrenals/Urinary Tract: Normal adrenal glands. Bilateral renal collecting system calculi. Moderate right-sided hydroureteronephrosis with decreased excretion/ function involving the right kidney. This continues to the level of a distal right ureteric 5x7 mm stone on image 73/series 2 and coronal image 63. Heterogeneous right renal enhancement is most apparent in the lower pole on image 29/ series 7. Normal urinary bladder. Stomach/Bowel: Normal stomach, without wall thickening. Normal colon and terminal ileum. Normal small bowel. Vascular/Lymphatic: Aortic and branch vessel atherosclerosis. No abdominopelvic adenopathy. Reproductive: Normal uterus and adnexa. Other: No significant free fluid. Musculoskeletal: Lumbosacral spondylosis. IMPRESSION: 1. Moderate right-sided urinary tract obstruction secondary to a distal right ureteric stone. 2. Heterogeneous right renal enhancement, likely representing concurrent pyelonephritis. 3. Bilateral  nephrolithiasis. 4.  Aortic Atherosclerosis (ICD10-I70.0). Electronically Signed   By: Abigail Miyamoto M.D.   On: 03/06/2017 10:42   Dg C-arm 1-60 Min-no Report  Result Date: 03/06/2017 Fluoroscopy was utilized by the requesting physician.  No radiographic interpretation.    (Echo, Carotid, EGD, Colonoscopy, ERCP)    Subjective:   Discharge Exam: Vitals:   03/23/17 0554 03/23/17 0957  BP: 135/78 140/88  Pulse: 90 (!) 104  Resp: 16   Temp: 98.3 F (36.8 C)   SpO2: 95%    Vitals:   03/22/17 1402 03/22/17 2007 03/23/17 0554 03/23/17 0957  BP: 139/74 132/77 135/78 140/88  Pulse: 100 (!) 113 90 (!) 104  Resp: 18 17 16    Temp: 98.1 F (36.7 C) 98.5 F (36.9 C) 98.3 F (36.8 C)   TempSrc: Oral Oral Oral   SpO2: 95% 98% 95%   Weight:      Height:        General: Pt is alert, awake, not in acute distress Cardiovascular: RRR, S1/S2 +, no rubs, no gallops Respiratory: CTA bilaterally, no wheezing, no rhonchi Abdominal: Soft, NT, ND, bowel sounds + Extremities: no edema, no cyanosis    The results of significant diagnostics from this hospitalization (including imaging, microbiology, ancillary and laboratory) are listed below for  reference.     Microbiology: Recent Results (from the past 240 hour(s))  Urine culture     Status: Abnormal   Collection Time: 03/21/17  1:05 AM  Result Value Ref Range Status   Specimen Description URINE, CLEAN CATCH  Final   Special Requests NONE  Final   Culture >=100,000 COLONIES/mL ESCHERICHIA COLI (A)  Final   Report Status 03/23/2017 FINAL  Final   Organism ID, Bacteria ESCHERICHIA COLI (A)  Final      Susceptibility   Escherichia coli - MIC*    AMPICILLIN <=2 SENSITIVE Sensitive     CEFAZOLIN <=4 SENSITIVE Sensitive     CEFTRIAXONE <=1 SENSITIVE Sensitive     CIPROFLOXACIN <=0.25 SENSITIVE Sensitive     GENTAMICIN <=1 SENSITIVE Sensitive     IMIPENEM <=0.25 SENSITIVE Sensitive     NITROFURANTOIN <=16 SENSITIVE Sensitive      TRIMETH/SULFA <=20 SENSITIVE Sensitive     AMPICILLIN/SULBACTAM <=2 SENSITIVE Sensitive     PIP/TAZO <=4 SENSITIVE Sensitive     Extended ESBL NEGATIVE Sensitive     * >=100,000 COLONIES/mL ESCHERICHIA COLI     Labs: BNP (last 3 results) No results for input(s): BNP in the last 8760 hours. Basic Metabolic Panel:  Recent Labs Lab 03/21/17 0115 03/22/17 1307  NA 138 138  K 3.7 4.0  CL 105 105  CO2 26 26  GLUCOSE 100* 97  BUN 13 10  CREATININE 0.44 0.39*  CALCIUM 9.1 9.0   Liver Function Tests: No results for input(s): AST, ALT, ALKPHOS, BILITOT, PROT, ALBUMIN in the last 168 hours. No results for input(s): LIPASE, AMYLASE in the last 168 hours. No results for input(s): AMMONIA in the last 168 hours. CBC:  Recent Labs Lab 03/21/17 0115 03/22/17 1307  WBC 14.8* 9.3  NEUTROABS 11.8* 6.3  HGB 12.3 12.4  HCT 37.2 37.7  MCV 89.0 88.3  PLT 250 266   Cardiac Enzymes: No results for input(s): CKTOTAL, CKMB, CKMBINDEX, TROPONINI in the last 168 hours. BNP: Invalid input(s): POCBNP CBG: No results for input(s): GLUCAP in the last 168 hours. D-Dimer No results for input(s): DDIMER in the last 72 hours. Hgb A1c No results for input(s): HGBA1C in the last 72 hours. Lipid Profile No results for input(s): CHOL, HDL, LDLCALC, TRIG, CHOLHDL, LDLDIRECT in the last 72 hours. Thyroid function studies No results for input(s): TSH, T4TOTAL, T3FREE, THYROIDAB in the last 72 hours.  Invalid input(s): FREET3 Anemia work up No results for input(s): VITAMINB12, FOLATE, FERRITIN, TIBC, IRON, RETICCTPCT in the last 72 hours. Urinalysis    Component Value Date/Time   COLORURINE YELLOW 03/21/2017 0105   APPEARANCEUR CLOUDY (A) 03/21/2017 0105   LABSPEC 1.013 03/21/2017 0105   PHURINE 5.0 03/21/2017 0105   GLUCOSEU NEGATIVE 03/21/2017 0105   HGBUR LARGE (A) 03/21/2017 0105   BILIRUBINUR NEGATIVE 03/21/2017 0105   KETONESUR NEGATIVE 03/21/2017 0105   PROTEINUR 100 (A) 03/21/2017  0105   NITRITE POSITIVE (A) 03/21/2017 0105   LEUKOCYTESUR LARGE (A) 03/21/2017 0105   Sepsis Labs Invalid input(s): PROCALCITONIN,  WBC,  LACTICIDVEN Microbiology Recent Results (from the past 240 hour(s))  Urine culture     Status: Abnormal   Collection Time: 03/21/17  1:05 AM  Result Value Ref Range Status   Specimen Description URINE, CLEAN CATCH  Final   Special Requests NONE  Final   Culture >=100,000 COLONIES/mL ESCHERICHIA COLI (A)  Final   Report Status 03/23/2017 FINAL  Final   Organism ID, Bacteria ESCHERICHIA COLI (A)  Final  Susceptibility   Escherichia coli - MIC*    AMPICILLIN <=2 SENSITIVE Sensitive     CEFAZOLIN <=4 SENSITIVE Sensitive     CEFTRIAXONE <=1 SENSITIVE Sensitive     CIPROFLOXACIN <=0.25 SENSITIVE Sensitive     GENTAMICIN <=1 SENSITIVE Sensitive     IMIPENEM <=0.25 SENSITIVE Sensitive     NITROFURANTOIN <=16 SENSITIVE Sensitive     TRIMETH/SULFA <=20 SENSITIVE Sensitive     AMPICILLIN/SULBACTAM <=2 SENSITIVE Sensitive     PIP/TAZO <=4 SENSITIVE Sensitive     Extended ESBL NEGATIVE Sensitive     * >=100,000 COLONIES/mL ESCHERICHIA COLI     Time coordinating discharge: Over 30 minutes  SIGNED:   Georgette Shell, MD  [facility] 03/23/2017, 10:48 AM Pager   If 7PM-7AM, please contact night-coverage [email or web address] Password [email or web address]

## 2017-03-27 ENCOUNTER — Other Ambulatory Visit: Payer: Self-pay | Admitting: Urology

## 2017-03-28 ENCOUNTER — Encounter (HOSPITAL_BASED_OUTPATIENT_CLINIC_OR_DEPARTMENT_OTHER): Payer: Self-pay | Admitting: *Deleted

## 2017-03-30 ENCOUNTER — Encounter (HOSPITAL_BASED_OUTPATIENT_CLINIC_OR_DEPARTMENT_OTHER): Payer: Self-pay | Admitting: *Deleted

## 2017-03-30 NOTE — Progress Notes (Addendum)
NPO AFTER MN.  ARRIVE AT 0745.  CURRENT LABS RESULTS AND EKG IN CHART AND EPIC. WILL TAKE AM MEDS DOS W/ SIPS OF WATER , NO VITAMINS. WILL BRING CPAP.

## 2017-04-02 NOTE — H&P (Signed)
H&P  Chief Complaint: Kidney stone  History of Present Illness: Laurie Wise is a 61 y.o. year old female who presents for ureteroscopic mgmt of a right distal ureteral stone. Initial presentation 03/06/2017 with an infected stone, and she underwent urgent stenting. She grew no specific bacteria from that visit, but then presented w/ E.coli UTI on 9/4. She has been treated for that and presents for definitive ureteroscopic mgmt.  Past Medical History:  Diagnosis Date  . Ductal carcinoma in situ (DCIS) of left breast  [facility] in [place]--- per pt no recurrence   dx s/p  left breast lumpectomy and radiation  . History of acute pyelonephritis 03/06/2017   w/ sepsis due to ureteral stone obstruction  . History of radiation therapy 2016   left breast   . Left ureteral stone   . [facility] disease [facility])   . Nephrolithiasis    bilateral non-obstructive per CT 03-06-2017  . OSA on CPAP    setting 8  . Sensation of pressure in bladder area   . Tachycardia    taking beta blocker  . Uses walker   . Wears glasses     Past Surgical History:  Procedure Laterality Date  . BREAST LUMPECTOMY Left 2016   [facility] in [place]   DCIS-lumpectomy  . CESAREAN SECTION  1990  . CYSTOSCOPY WITH STENT PLACEMENT Right 03/06/2017   Procedure: CYSTOSCOPY WITH RIGHT STENT PLACEMENT;  Surgeon: Franchot Gallo, MD;  Location: [facility];  Service: Urology;  Laterality: Right;  . LAMINECTOMY  2003   L4-5  . PORTA CATH INSERTION  08/2016   power port for ALS IV medication    Home Medications:  No prescriptions prior to admission.    Allergies:  Allergies  Allergen Reactions  . Adhesive [Tape] Other (See Comments)    Blisters     Family History  Problem Relation Age of Onset  . Parkinson's disease Mother   . Renal cancer Father   . CAD Father   . Diabetes Other   . Stroke Neg Hx   . Clotting disorder Neg Hx     Social History:  reports that she quit smoking  about 46 years ago. Her smoking use included Cigarettes. She quit after 10.00 years of use. She has never used smokeless tobacco. She reports that she does not drink alcohol or use drugs.  ROS: A complete review of systems was performed.  All systems are negative except for pertinent findings as noted.  Physical Exam:  Vital signs in last 24 hours:   General:  Alert and oriented, No acute distress HEENT: Normocephalic, atraumatic Neck: No JVD or lymphadenopathy Cardiovascular: Regular rate and rhythm Lungs: Clear bilaterally Abdomen: Soft, nontender, nondistended, no abdominal masses Back: No CVA tenderness Extremities: No edema Neurologic: Grossly intact  Laboratory Data:  No results found for this or any previous visit (from the past 24 hour(s)). No results found for this or any previous visit (from the past 240 hour(s)). Creatinine: No results for input(s): CREATININE in the last 168 hours.  Radiologic Imaging: No results found.  Impression/Assessment:  Right ureteral calculus--s/p recent stenting  Plan:  Cystoscopy,rt J2 stent extraction, rt URS, holmium laser litho and extraction of stone,   Mirella Gueye M 04/02/2017, 8:03 PM  Lillette Boxer. Twylia Oka MD

## 2017-04-03 ENCOUNTER — Ambulatory Visit (HOSPITAL_BASED_OUTPATIENT_CLINIC_OR_DEPARTMENT_OTHER): Payer: BLUE CROSS/BLUE SHIELD | Admitting: Anesthesiology

## 2017-04-03 ENCOUNTER — Ambulatory Visit (HOSPITAL_BASED_OUTPATIENT_CLINIC_OR_DEPARTMENT_OTHER)
Admission: RE | Admit: 2017-04-03 | Discharge: 2017-04-03 | Disposition: A | Discharge status: Home / Self Care | Payer: BLUE CROSS/BLUE SHIELD | Source: Ambulatory Visit | Attending: Urology | Admitting: Urology

## 2017-04-03 ENCOUNTER — Encounter (HOSPITAL_BASED_OUTPATIENT_CLINIC_OR_DEPARTMENT_OTHER): Payer: Self-pay | Admitting: *Deleted

## 2017-04-03 ENCOUNTER — Encounter (HOSPITAL_BASED_OUTPATIENT_CLINIC_OR_DEPARTMENT_OTHER)
Admission: RE | Disposition: A | Discharge status: Home / Self Care | Payer: Self-pay | Source: Ambulatory Visit | Attending: Urology

## 2017-04-03 DIAGNOSIS — N201 Calculus of ureter: Secondary | ICD-10-CM | POA: Diagnosis not present

## 2017-04-03 DIAGNOSIS — G1221 Amyotrophic lateral sclerosis: Secondary | ICD-10-CM | POA: Diagnosis not present

## 2017-04-03 DIAGNOSIS — Z8249 Family history of ischemic heart disease and other diseases of the circulatory system: Secondary | ICD-10-CM | POA: Insufficient documentation

## 2017-04-03 DIAGNOSIS — Z820 Family history of epilepsy and other diseases of the nervous system: Secondary | ICD-10-CM | POA: Diagnosis not present

## 2017-04-03 DIAGNOSIS — Z87442 Personal history of urinary calculi: Secondary | ICD-10-CM | POA: Diagnosis not present

## 2017-04-03 DIAGNOSIS — Z87891 Personal history of nicotine dependence: Secondary | ICD-10-CM | POA: Insufficient documentation

## 2017-04-03 DIAGNOSIS — Z833 Family history of diabetes mellitus: Secondary | ICD-10-CM | POA: Diagnosis not present

## 2017-04-03 DIAGNOSIS — Z803 Family history of malignant neoplasm of breast: Secondary | ICD-10-CM | POA: Diagnosis not present

## 2017-04-03 DIAGNOSIS — Z8051 Family history of malignant neoplasm of kidney: Secondary | ICD-10-CM | POA: Insufficient documentation

## 2017-04-03 DIAGNOSIS — G4733 Obstructive sleep apnea (adult) (pediatric): Secondary | ICD-10-CM | POA: Diagnosis not present

## 2017-04-03 DIAGNOSIS — Z91048 Other nonmedicinal substance allergy status: Secondary | ICD-10-CM | POA: Insufficient documentation

## 2017-04-03 DIAGNOSIS — N200 Calculus of kidney: Secondary | ICD-10-CM | POA: Diagnosis not present

## 2017-04-03 DIAGNOSIS — N136 Pyonephrosis: Secondary | ICD-10-CM | POA: Insufficient documentation

## 2017-04-03 DIAGNOSIS — Z923 Personal history of irradiation: Secondary | ICD-10-CM | POA: Diagnosis not present

## 2017-04-03 DIAGNOSIS — R000 Tachycardia, unspecified: Secondary | ICD-10-CM | POA: Insufficient documentation

## 2017-04-03 HISTORY — PX: CYSTOSCOPY/URETEROSCOPY/HOLMIUM LASER/STENT PLACEMENT: SHX6546

## 2017-04-03 SURGERY — CYSTOSCOPY/URETEROSCOPY/HOLMIUM LASER/STENT PLACEMENT
Anesthesia: General | Site: Renal | Laterality: Right

## 2017-04-03 MED ORDER — PHENYLEPHRINE 40 MCG/ML (10ML) SYRINGE FOR IV PUSH (FOR BLOOD PRESSURE SUPPORT)
PREFILLED_SYRINGE | INTRAVENOUS | Status: AC
  Filled 2017-04-03: qty 10

## 2017-04-03 MED ORDER — FENTANYL CITRATE (PF) 100 MCG/2ML IJ SOLN
INTRAMUSCULAR | Status: AC
  Filled 2017-04-03: qty 2

## 2017-04-03 MED ORDER — KETOROLAC TROMETHAMINE 30 MG/ML IJ SOLN
INTRAMUSCULAR | Status: DC | PRN
  Administered 2017-04-03: 30 mg via INTRAVENOUS

## 2017-04-03 MED ORDER — SODIUM CHLORIDE 0.9 % IR SOLN
Status: DC | PRN
  Administered 2017-04-03: 6000 mL via INTRAVESICAL

## 2017-04-03 MED ORDER — MIDAZOLAM HCL 2 MG/2ML IJ SOLN
INTRAMUSCULAR | Status: AC
Start: 2017-04-03 — End: 2017-04-03
  Filled 2017-04-03: qty 2

## 2017-04-03 MED ORDER — LIDOCAINE 2% (20 MG/ML) 5 ML SYRINGE
INTRAMUSCULAR | Status: DC | PRN
  Administered 2017-04-03: 40 mg via INTRAVENOUS
  Administered 2017-04-03: 60 mg via INTRAVENOUS

## 2017-04-03 MED ORDER — CEFAZOLIN SODIUM-DEXTROSE 2-4 GM/100ML-% IV SOLN
INTRAVENOUS | Status: AC
  Filled 2017-04-03: qty 100

## 2017-04-03 MED ORDER — PHENYLEPHRINE 40 MCG/ML (10ML) SYRINGE FOR IV PUSH (FOR BLOOD PRESSURE SUPPORT)
PREFILLED_SYRINGE | INTRAVENOUS | Status: DC | PRN
  Administered 2017-04-03: 80 ug via INTRAVENOUS
  Administered 2017-04-03: 120 ug via INTRAVENOUS
  Administered 2017-04-03 (×2): 80 ug via INTRAVENOUS
  Administered 2017-04-03: 40 ug via INTRAVENOUS

## 2017-04-03 MED ORDER — DEXAMETHASONE SODIUM PHOSPHATE 10 MG/ML IJ SOLN
INTRAMUSCULAR | Status: AC
Start: 2017-04-03 — End: 2017-04-03
  Filled 2017-04-03: qty 1

## 2017-04-03 MED ORDER — MIDAZOLAM HCL 5 MG/5ML IJ SOLN
INTRAMUSCULAR | Status: DC | PRN
  Administered 2017-04-03: 2 mg via INTRAVENOUS

## 2017-04-03 MED ORDER — ONDANSETRON HCL 4 MG/2ML IJ SOLN
INTRAMUSCULAR | Status: AC
  Filled 2017-04-03: qty 2

## 2017-04-03 MED ORDER — CEFAZOLIN SODIUM-DEXTROSE 2-4 GM/100ML-% IV SOLN
2.0000 g | INTRAVENOUS | Status: AC
  Administered 2017-04-03: 2 g via INTRAVENOUS
  Filled 2017-04-03: qty 100

## 2017-04-03 MED ORDER — FENTANYL CITRATE (PF) 100 MCG/2ML IJ SOLN
INTRAMUSCULAR | Status: DC | PRN
  Administered 2017-04-03: 25 ug via INTRAVENOUS
  Administered 2017-04-03: 75 ug via INTRAVENOUS

## 2017-04-03 MED ORDER — DEXAMETHASONE SODIUM PHOSPHATE 4 MG/ML IJ SOLN
INTRAMUSCULAR | Status: DC | PRN
  Administered 2017-04-03: 10 mg via INTRAVENOUS

## 2017-04-03 MED ORDER — IOHEXOL 300 MG/ML  SOLN
INTRAMUSCULAR | Status: DC | PRN
  Administered 2017-04-03: 8 mL via URETHRAL

## 2017-04-03 MED ORDER — KETOROLAC TROMETHAMINE 30 MG/ML IJ SOLN
INTRAMUSCULAR | Status: AC
  Filled 2017-04-03: qty 1

## 2017-04-03 MED ORDER — CEPHALEXIN 500 MG PO CAPS: 500.0000 mg | ORAL_CAPSULE | Freq: Two times a day (BID) | ORAL | 0 refills | Status: AC

## 2017-04-03 MED ORDER — PROPOFOL 10 MG/ML IV BOLUS
INTRAVENOUS | Status: DC | PRN
  Administered 2017-04-03: 40 mg via INTRAVENOUS
  Administered 2017-04-03: 160 mg via INTRAVENOUS

## 2017-04-03 MED ORDER — LACTATED RINGERS IV SOLN
INTRAVENOUS | Status: DC
  Administered 2017-04-03 (×2): via INTRAVENOUS
  Filled 2017-04-03: qty 1000

## 2017-04-03 MED ORDER — PROPOFOL 10 MG/ML IV BOLUS
INTRAVENOUS | Status: AC
Start: 2017-04-03 — End: 2017-04-03
  Filled 2017-04-03: qty 20

## 2017-04-03 MED ORDER — LIDOCAINE 2% (20 MG/ML) 5 ML SYRINGE
INTRAMUSCULAR | Status: AC
Start: 2017-04-03 — End: 2017-04-03
  Filled 2017-04-03: qty 5

## 2017-04-03 MED ORDER — ONDANSETRON HCL 4 MG/2ML IJ SOLN
INTRAMUSCULAR | Status: DC | PRN
  Administered 2017-04-03: 4 mg via INTRAVENOUS

## 2017-04-03 SURGICAL SUPPLY — 18 items
BAG DRAIN URO-CYSTO SKYTR STRL (DRAIN) ×2 IMPLANT
BASKET DAKOTA 1.9FR 11X120 (BASKET) ×2 IMPLANT
CATH URET 5FR 28IN OPEN ENDED (CATHETERS) ×2 IMPLANT
CLOTH BEACON ORANGE TIMEOUT ST (SAFETY) ×2 IMPLANT
FIBER LASER TRAC TIP (UROLOGICAL SUPPLIES) ×2 IMPLANT
GLOVE BIO SURGEON STRL SZ8 (GLOVE) ×2 IMPLANT
GOWN STRL REUS W/ TWL LRG LVL3 (GOWN DISPOSABLE) ×1 IMPLANT
GOWN STRL REUS W/ TWL XL LVL3 (GOWN DISPOSABLE) ×1 IMPLANT
GOWN STRL REUS W/TWL LRG LVL3 (GOWN DISPOSABLE) ×1
GOWN STRL REUS W/TWL XL LVL3 (GOWN DISPOSABLE) ×1
GUIDEWIRE STR DUAL SENSOR (WIRE) ×2 IMPLANT
IV NS IRRIG 3000ML ARTHROMATIC (IV SOLUTION) ×4 IMPLANT
KIT RM TURNOVER CYSTO AR (KITS) ×2 IMPLANT
MANIFOLD NEPTUNE II (INSTRUMENTS) ×2 IMPLANT
PACK CYSTO (CUSTOM PROCEDURE TRAY) ×2 IMPLANT
SHEATH ACCESS URETERAL 38CM (SHEATH) ×2 IMPLANT
STENT URET 6FRX24 CONTOUR (STENTS) ×2 IMPLANT
TUBE CONNECTING 12X1/4 (SUCTIONS) ×2 IMPLANT

## 2017-04-03 NOTE — Op Note (Signed)
Preoperative diagnosis: History of infected right ureteral stone, status post stenting.  Postoperative diagnosis: Same  Principal procedure: Cystoscopy, right double-J stent extraction, right retrograde ureteropyelogram, fluoroscopic interpretation, use of fluoroscopy for under 1 hour, right ureteroscopy, holmium laser lithotripsy of right ureteral stone, extraction of right ureteral stone fragments, extraction of multiple right renal stones, replacement of 6 French by 24 centimeter contour stent in right ureter with tether  Surgeon: Utah Delauder  Anesthesia: Gen. with LMA  Complications: None  Specimen: Stone fragments, to the patient's family.  Estimated blood loss: None.  Indications: 61 year old female, recently presenting with an infected right ureteral stone with hydronephrosis.  She was treated with antibiotic management and some urgent stenting.  In the interim, she developed a fever and this procedure was postponed from its first scheduling.  She is currently on Cipro.  She's had no significant urinary symptomatology except for stent discomfort.  She presents at this time for ureteroscopic management of a right ureteral stone with holmium laser fragmentation and extraction, removal of right renal calculi and probable stent replacement.  She has been instructed in the procedure, expected risks and complications.  She understands these and desires to proceed.  Findings: Bladder revealed normal urothelium, normally placed ureteral orifices.  There was the stent present at the right ureteral orifice.  Retrograde ureteropyelogram performed with Omnipaque revealed a filling defect in the distal third ureter.  No other abnormalities were seen along the course of the ureter or the pyelo-calyceal system which was also filled.  Description of procedure: The patient was properly identified in the holding area and the correct surgical side marked.  She was then taken to the operating room where general  anesthetic was administered.  She received preoperative IV antibiotics.  Her positioning was in the dorsolithotomy position.  Genitalia and perineum were prepped and draped, timeout performed.  A 21 French panendoscope was advanced into her bladder with the above-mentioned findings noted.  The right ureteral stent was grasped and extracted through her urethral meatus.  This was then cannulated with a serum 0.038 inch sensor-tip guidewire which was then negotiated through the stent and up into the upper pole calyceal system were fluoroscopically.  A curl was seen.  The stent was then removed over top of the guidewire.  The scope and a 5 [place] open-ended catheter were utilized to perform a retrograde study which revealed the filling defect in the right distal ureter, approximately 5-6 centimeters up.  No other abnormalities were noted.  The cystoscope and open-ended catheter were then removed.  A 6 French dual-lumen ureteroscope was advanced up into the ureter where the stone was encountered in the expected location.  I tried grasping and extracting the stone, as he ureter was somewhat dilated but this could not be advanced down the ureter.  The 200 micron fiber was then utilized to pass through the scope and applying holmium laser energy to the stone at 0.8 joules and 15 hertz, multiple fragments were obtained.  I then grasped these with the [employer] and extract them easily into the bladder where there were dropped.  Further inspection of the ureter revealed no further stones.  The ureteroscope was advanced all the way to the renal pelvis but nothing was seen.  I then removed the ureteroscope.  With the guidewire in place, I dilated the ureter first with the 12 and then the entire 06/1434 centimeter ureteral access catheter.  This easily passed into the ureter over the guidewire.  The guidewire and  the core were then removed.   I then passed the 6 French dual-lumen flexible ureteroscope  into the ureteral access catheter, easily into the renal pelvis.  There were approximately 5-6 calyces, each of these were inspected.  All of the papillae had Randall's plaques.  However, in the upper pole calyx and in the interpolar/lower pole calyx, several smaller stones and a couple of 3 millimeters stones were encountered.  They were patiently grasped and extracted through the access catheter.  In this manner, probably 20 small stones in the kidney were removed.  Further inspection of the renal pelvis and calyceal system revealed no further stones.  I then removed the flexible ureteroscope.  The guidewire was replaced through the access catheter, and access catheter removed.  The guidewire was backloaded through the 21 [place] cystoscope, and using visual and fluoroscopic guidance, a 6 [place] by 24 centimeter contour double-J stent was adequately positioned in the ureter, and once deployed with the wire removed, good Roxanol and distal curls were seen using fluoroscopy and visual guidance, respectively.  At this point, small fragments were easily rinsed out of the bladder.  The scope was then removed.  The string was then knotted, trimmed, and tucked inside the vagina.  The procedure was then terminated.  The patient was then awakened.  She was taken the PACU in stable condition, having tolerated the procedure well.

## 2017-04-03 NOTE — Transfer of Care (Signed)
Last Vitals:  Vitals:   04/03/17 0750 04/03/17 1102  BP: 134/87 (!) 154/91  Pulse: 90 (!) 103  Resp: 16 13  Temp: 37.1 C 36.8 C  SpO2: 97% 96%    Last Pain:  Vitals:   04/03/17 0750  TempSrc: Oral         Immediate Anesthesia Transfer of Care Note  Patient: Laurie Wise  Procedure(s) Performed: Procedure(s) (LRB): CYSTOSCOPY/URETEROSCOPY/HOLMIUM LASER/STONE EXTRACTION/STENT REPLACEMENT (Right)  Patient Location: PACU  Anesthesia Type: General  Level of Consciousness: awake, alert  and oriented  Airway & Oxygen Therapy: Patient Spontanous Breathing and Patient connected to nasal cannula oxygen  Post-op Assessment: Report given to PACU RN and Post -op Vital signs reviewed and stable  Post vital signs: Reviewed and stable  Complications: No apparent anesthesia complications

## 2017-04-03 NOTE — Anesthesia Postprocedure Evaluation (Signed)
Anesthesia Post Note  Patient: Laurie Wise  Procedure(s) Performed: Procedure(s) (LRB): CYSTOSCOPY/URETEROSCOPY/HOLMIUM LASER/STONE EXTRACTION/STENT REPLACEMENT (Right)     Patient location during evaluation: PACU Anesthesia Type: General Level of consciousness: awake and alert Pain management: pain level controlled Vital Signs Assessment: post-procedure vital signs reviewed and stable Respiratory status: spontaneous breathing, nonlabored ventilation, respiratory function stable and patient connected to nasal cannula oxygen Cardiovascular status: blood pressure returned to baseline and stable Postop Assessment: no apparent nausea or vomiting Anesthetic complications: no    Last Vitals:  Vitals:   04/03/17 1115 04/03/17 1130  BP: 129/79 121/79  Pulse: 88 83  Resp: 13 13  Temp:    SpO2: 96% 92%    Last Pain:  Vitals:   04/03/17 0750  TempSrc: Oral                 Tiajuana Amass

## 2017-04-03 NOTE — Anesthesia Preprocedure Evaluation (Addendum)
Anesthesia Evaluation  Patient identified by MRN, date of birth, ID band Patient awake    Reviewed: Allergy & Precautions, NPO status , Patient's Chart, lab work & pertinent test results  Airway Mallampati: I  TM Distance: >3 FB Neck ROM: Full    Dental no notable dental hx.    Pulmonary sleep apnea , former smoker,    Pulmonary exam normal breath sounds clear to auscultation       Cardiovascular negative cardio ROS Normal cardiovascular exam Rhythm:Regular Rate:Normal     Neuro/Psych ALS  Neuromuscular disease negative psych ROS   GI/Hepatic negative GI ROS, Neg liver ROS,   Endo/Other  negative endocrine ROS  Renal/GU Renal diseasenegative Renal ROS  negative genitourinary   Musculoskeletal negative musculoskeletal ROS (+)   Abdominal   Peds negative pediatric ROS (+)  Hematology negative hematology ROS (+)   Anesthesia Other Findings ALS  Reproductive/Obstetrics negative OB ROS                            Anesthesia Physical  Anesthesia Plan  ASA: III  Anesthesia Plan: General   Post-op Pain Management:    Induction: Intravenous  PONV Risk Score and Plan: 3 and Ondansetron, Dexamethasone, Midazolam and Treatment may vary due to age or medical condition  Airway Management Planned: LMA  Additional Equipment:   Intra-op Plan:   Post-operative Plan: Extubation in OR  Informed Consent: I have reviewed the patients History and Physical, chart, labs and discussed the procedure including the risks, benefits and alternatives for the proposed anesthesia with the patient or authorized representative who has indicated his/her understanding and acceptance.   Dental advisory given  Plan Discussed with: CRNA  Anesthesia Plan Comments: (  )        Anesthesia Quick Evaluation

## 2017-04-03 NOTE — Discharge Instructions (Signed)
1. You may see some blood in the urine and may have some burning with urination for 48-72 hours. You also may notice that you have to urinate more frequently or urgently after your procedure which is normal.  2. You should call should you develop an inability urinate, fever > 101, persistent nausea and vomiting that prevents you from eating or drinking to stay hydrated.  3. If you have a stent, you will likely urinate more frequently and urgently until the stent is removed and you may experience some discomfort/pain in the lower abdomen and flank especially when urinating. You may take pain medication prescribed to you if needed for pain. You may also intermittently have blood in the urine until the stent is removed.  It is okay to pull the thread (it may still be tucked up inside your vagina) on Thursday morning to remove the stent. 4. If you have a catheter, you will be taught how to take care of the catheter by the nursing staff prior to discharge from the hospital.  You may periodically feel a strong urge to void with the catheter in place.  This is a bladder spasm and most often can occur when having a bowel movement or moving around. It is typically self-limited and usually will stop after a few minutes.  You may use some Vaseline or Neosporin around the tip of the catheter to reduce friction at the tip of the penis. You may also see some blood in the urine.  A very small amount of blood can make the urine look quite red.  As long as the catheter is draining well, there usually is not a problem.  However, if the catheter is not draining well and is bloody, you should call the office [phone]) to notify us.  Post Anesthesia Home Care Instructions  Activity: Get plenty of rest for the remainder of the day. A responsible individual must stay with you for 24 hours following the procedure.  For the next 24 hours, DO NOT: -Drive a car -Paediatric nurse -Drink alcoholic beverages -Take any  medication unless instructed by your physician -Make any legal decisions or sign important papers.  Meals: Start with liquid foods such as gelatin or soup. Progress to regular foods as tolerated. Avoid greasy, spicy, heavy foods. If nausea and/or vomiting occur, drink only clear liquids until the nausea and/or vomiting subsides. Call your physician if vomiting continues.  Special Instructions/Symptoms: Your throat may feel dry or sore from the anesthesia or the breathing tube placed in your throat during surgery. If this causes discomfort, gargle with warm salt water. The discomfort should disappear within 24 hours.  If you had a scopolamine patch placed behind your ear for the management of post- operative nausea and/or vomiting:  1. The medication in the patch is effective for 72 hours, after which it should be removed.  Wrap patch in a tissue and discard in the trash. Wash hands thoroughly with soap and water. 2. You may remove the patch earlier than 72 hours if you experience unpleasant side effects which may include dry mouth, dizziness or visual disturbances. 3. Avoid touching the patch. Wash your hands with soap and water after contact with the patch.

## 2017-04-03 NOTE — Anesthesia Procedure Notes (Signed)
Procedure Name: LMA Insertion Date/Time: 04/03/2017 10:02 AM Performed by: Suzette Battiest Pre-anesthesia Checklist: Patient identified, Emergency Drugs available, Suction available and Patient being monitored Patient Re-evaluated:Patient Re-evaluated prior to induction Oxygen Delivery Method: Circle system utilized Preoxygenation: Pre-oxygenation with 100% oxygen Induction Type: IV induction Ventilation: Mask ventilation without difficulty LMA: LMA inserted LMA Size: 4.0 Number of attempts: 1 Airway Equipment and Method: Bite block Placement Confirmation: positive ETCO2 Tube secured with: Tape Dental Injury: Teeth and Oropharynx as per pre-operative assessment

## 2017-04-03 NOTE — Interval H&P Note (Signed)
History and Physical Interval Note:  04/03/2017 9:40 AM  Laurie Wise  has presented today for surgery, with the diagnosis of RIGHT URETERAL STONE  The various methods of treatment have been discussed with the patient and family. After consideration of risks, benefits and other options for treatment, the patient has consented to  Procedure(s): CYSTOSCOPY/URETEROSCOPY/HOLMIUM LASER/STONE EXTRACTION/STENT REMOVAL (Right) as a surgical intervention .  The patient's history has been reviewed, patient examined, no change in status, stable for surgery.  I have reviewed the patient's chart and labs.  Questions were answered to the patient's satisfaction.     [name]

## 2017-04-04 ENCOUNTER — Encounter (HOSPITAL_BASED_OUTPATIENT_CLINIC_OR_DEPARTMENT_OTHER): Payer: Self-pay | Admitting: Urology

## 2017-04-06 DIAGNOSIS — Z9911 Dependence on respirator [ventilator] status: Secondary | ICD-10-CM | POA: Diagnosis not present

## 2017-04-06 DIAGNOSIS — Z9189 Other specified personal risk factors, not elsewhere classified: Secondary | ICD-10-CM | POA: Diagnosis not present

## 2017-04-06 DIAGNOSIS — Z23 Encounter for immunization: Secondary | ICD-10-CM | POA: Diagnosis not present

## 2017-04-06 DIAGNOSIS — G1221 Amyotrophic lateral sclerosis: Secondary | ICD-10-CM | POA: Diagnosis not present

## 2017-04-06 DIAGNOSIS — Z95828 Presence of other vascular implants and grafts: Secondary | ICD-10-CM | POA: Diagnosis not present

## 2017-04-18 DIAGNOSIS — R399 Unspecified symptoms and signs involving the genitourinary system: Secondary | ICD-10-CM | POA: Diagnosis not present

## 2017-04-18 DIAGNOSIS — R799 Abnormal finding of blood chemistry, unspecified: Secondary | ICD-10-CM | POA: Diagnosis not present

## 2017-04-19 ENCOUNTER — Ambulatory Visit: Payer: BLUE CROSS/BLUE SHIELD | Admitting: Rehabilitative and Restorative Service Providers"

## 2017-04-26 DIAGNOSIS — N200 Calculus of kidney: Secondary | ICD-10-CM | POA: Diagnosis not present

## 2017-05-02 DIAGNOSIS — N200 Calculus of kidney: Secondary | ICD-10-CM | POA: Diagnosis not present

## 2017-05-04 ENCOUNTER — Ambulatory Visit: Payer: BLUE CROSS/BLUE SHIELD | Attending: Neurology | Admitting: Rehabilitative and Restorative Service Providers"

## 2017-05-04 DIAGNOSIS — M6281 Muscle weakness (generalized): Secondary | ICD-10-CM | POA: Insufficient documentation

## 2017-05-04 DIAGNOSIS — R2689 Other abnormalities of gait and mobility: Secondary | ICD-10-CM | POA: Diagnosis present

## 2017-05-04 DIAGNOSIS — R29818 Other symptoms and signs involving the nervous system: Secondary | ICD-10-CM | POA: Diagnosis present

## 2017-05-05 NOTE — Therapy (Signed)
[facility] [address] [place], [place], [address] Phone: [phone]   Fax:  [phone]  Physical Therapy Evaluation  Patient Details  Name: Laurie Wise MRN: [id number] Date of Birth: 1956/06/10 Referring Provider: Rolena Infante, MD  Encounter Date: 05/04/2017      PT End of Session - 05/05/17 0731    Visit Number 1   Number of Visits 17   Date for PT Re-Evaluation 07/04/17   Authorization Type private insurance   PT Start Time 0848   PT Stop Time 0930   PT Time Calculation (min) 42 min      Past Medical History:  Diagnosis Date  . Ductal carcinoma in situ (DCIS) of left breast  [facility] in [place]--- per pt no recurrence   dx s/p  left breast lumpectomy and radiation  . History of acute pyelonephritis 03/06/2017   w/ sepsis due to ureteral stone obstruction  . History of radiation therapy 2016   left breast   . Left ureteral stone   . [facility] disease [facility])   . Nephrolithiasis    bilateral non-obstructive per CT 03-06-2017  . OSA on CPAP    setting 8  . Sensation of pressure in bladder area   . Tachycardia    taking beta blocker  . Uses walker   . Wears glasses     Past Surgical History:  Procedure Laterality Date  . BREAST LUMPECTOMY Left 2016   [facility] in [place]   DCIS-lumpectomy  . CESAREAN SECTION  1990  . CYSTOSCOPY WITH STENT PLACEMENT Right 03/06/2017   Procedure: CYSTOSCOPY WITH RIGHT STENT PLACEMENT;  Surgeon: Franchot Gallo, MD;  Location: [facility];  Service: Urology;  Laterality: Right;  . CYSTOSCOPY/URETEROSCOPY/HOLMIUM LASER/STENT PLACEMENT Right 04/03/2017   Procedure: CYSTOSCOPY/URETEROSCOPY/HOLMIUM LASER/STONE EXTRACTION/STENT REPLACEMENT;  Surgeon: Franchot Gallo, MD;  Location: [facility];  Service: Urology;  Laterality: Right;  . LAMINECTOMY  2003   L4-5  . PORTA CATH INSERTION  08/2016   power port for ALS IV  medication    There were no vitals filed for this visit.       Subjective Assessment - 05/04/17 0854    Subjective The patient reports onset of symptoms 4-5 years ago in which she was walking slower and also noted weakness in her right foot (couldn't curl toes, or raise up on toes).  She reports she saw her back doctor who did not note any radicular symptoms.  She notes generalized muscle weakness with right leg most affected.  She had her first AFO R leg a year ago.  Her current one is articulating at the ankle with custom fit.  She ambulates with a SPC.  "I have a walker, but I have never used it".  She also purchased an Transport planner and uses it for travel.    She notes 3 falls in the last couple of months because R toe catches (when without brace).  She doesn't wear AFO in the home.    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   Patient Stated Goals "Overall strength, and in that process learn how to get up off the floor."  Also recently obtained a separate knee brace after trying KAFO *wants to look at options.   Currently in Pain? No/denies            [facility] PT Assessment - 05/04/17 0900      Assessment  Medical Diagnosis ALS, muscle weakness, abnormality of gait and mobility, foot drop/right.   Referring Provider Rolena Infante, MD   Onset Date/Surgical Date --  October 2016 ALS diagnosis   Hand Dominance Right   Prior Therapy none     Precautions   Precautions Fall   Required Braces or Orthoses Other Brace/Splint   Other Brace/Splint AFO right side, KAFO did not work well and she recently got a swedish knee cage (it still let her knee buckle).  She has a knee brace with dials on the side that prevent hyperextension and flexion).  She ordered it and hasn't used it yet.      Restrictions   Weight Bearing Restrictions No     Balance Screen   Has the patient fallen in the past 6 months Yes   How many times? 4   Has the  patient had a decrease in activity level because of a fear of falling?  Yes  due to evaluation   Is the patient reluctant to leave their home because of a fear of falling?  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  Her husband's job brought them to [place].     Sensation   Light Touch Appears Intact     ROM / Strength   AROM / PROM / Strength AROM;Strength     Strength   Strength Assessment Site Shoulder;Elbow;Forearm;Wrist;Hand;Hip;Knee;Ankle   Right/Left Shoulder Right;Left   Right Shoulder Flexion 3+/5   Right Shoulder ABduction 3+/5   Left Shoulder Flexion 3/5   Left Shoulder ABduction 3/5   Right/Left Elbow Right;Left   Right Elbow Flexion 5/5   Right Elbow Extension 5/5   Left Elbow Flexion 5/5   Left Elbow Extension 4/5   Right/Left Forearm --   Right/Left Wrist Right;Left   Right Wrist Flexion 5/5   Right Wrist Extension 5/5   Left Wrist Flexion 5/5   Left Wrist Extension 5/5   Right/Left hand Right;Left  finger weakness   Right/Left Hip Right;Left   Right Hip Flexion 3/5   Left Hip Flexion 4/5   Right/Left Knee Right;Left   Right Knee Flexion 3/5   Right Knee Extension 3/5   Left Knee Flexion 5/5   Left Knee Extension 5/5   Right/Left Ankle Right;Left   Right Ankle Dorsiflexion 2+/5   Left Ankle Dorsiflexion 3+/5   Left Ankle Plantar Flexion 3/5     Bed Mobility   Bed Mobility --  Reports difficulty rolling in bed and getting in/out of bed.     Transfers   Transfers Sit to Stand   Sit to Stand 6: Modified independent (Device/Increase  time);With upper extremity assist     Ambulation/Gait   Ambulation/Gait Yes   Ambulation/Gait Assistance 6: Modified independent (Device/Increase time)   Ambulation Distance (Feet) 200 Feet   Assistive device Straight cane   Gait Pattern --  R foot ER for knee control, recurvatum, foot slap   Ambulation Surface Level;Indoor   Gait velocity 2.05 ft/sec   Stairs Yes   Stairs Assistance  6: Modified independent (Device/Increase time)   Stair Management Technique Two rails;Step to pattern   Number of Stairs 4            Objective measurements completed on examination: See above findings.                  PT Education - 05/05/17 0731    Education provided Yes   Education Details goals of physical therapy   Person(s) Educated Patient   Methods Explanation   Comprehension Verbalized understanding          PT Short Term Goals - 05/05/17 0732      PT SHORT TERM GOAL #1   Title The patient will return demo HEP for flexibility and maintenance of strength.   Time 4   Period Weeks   Target Date 06/04/17     PT SHORT TERM GOAL #2   Title The patient will be further assessed on bed mobility and LTG to follow (currently notes crawling on hands/knees and "flopping" over).   Time 4   Period Weeks   Target Date 06/04/17     PT SHORT TERM GOAL #3   Title The patient will improve gait speed from 2.05 ft/sec to 2.5 ft/sec utilizing more supportive assitive device and R AFO/knee brace to improve stability.    Time 4   Period Weeks   Target Date 06/04/17     PT SHORT TERM GOAL #4   Title The patient will move floor<>stand with UE support and min A   Time 4   Period Weeks   Target Date 06/04/17           PT Long Term Goals - 05/05/17 0901      PT LONG TERM GOAL #1   Title The patient will verbalize understanding of community welllness for post d/c.   Time 8   Period Weeks   Target Date 07/04/17     PT LONG TERM GOAL #2   Title The patient will have R LE  bracing needs met to improve energy efficiency and safety with gait activities.   Time 8   Period Weeks   Target Date 07/04/17     PT LONG TERM GOAL #3   Title The patient will ambulate x 800 ft with RW (if willing to consider for longer community outings) mod indep to improve community access.   Time 8   Period Weeks   Target Date 07/04/17     PT LONG TERM GOAL #4   Title The patient will move floor<>stand with UE support modified indep.   Time 8   Period Weeks   Target Date 07/04/17     PT LONG TERM GOAL #5   Title Bed mobility goal to follow.   Time 8   Period Weeks   Target Date 07/04/17                Plan - 05/05/17 9485    Clinical Impression Statement The patient is a 61 year old female presenting with 4-5 year h/o progressive weakness dx with ALS in 04/2015.  She presents today with goals of improving strength.  PT and patient discussed trying to maintain strenth, compensate for weakness through bracing or assistive devices and optimizing energy efficiency with gait activities.  Patient appears to have slower progression of disease process for ALS with bilateral proximal weakness and R LE weakness.     History and Personal Factors relevant to plan of care: Unable to work,  fatigue, ALS, h/o falls   Clinical Presentation Evolving   Clinical Presentation due to: nature of diagnosis   Clinical Decision Making Moderate   Rehab Potential Good  to the stated goals   Clinical Impairments Affecting Rehab Potential progressive neuromuscular disease   PT Frequency 2x / week   PT Duration 8 weeks   PT Treatment/Interventions ADLs/Self Care Home Management;Functional mobility training;Gait training;Therapeutic activities;Therapeutic exercise;Balance training;Neuromuscular re-education;Patient/family education;Orthotic Fit/Training;Manual techniques   PT Next Visit Plan Look for OT order; Establish HEP for R LE strengthening; gait with R AFO + knee brace; assess bed mobility/  trunk strength   Consulted and Agree with Plan of Care Patient      Patient will benefit from skilled therapeutic intervention in order to improve the following deficits and impairments:  Abnormal gait, Decreased balance, Decreased activity tolerance, Decreased strength, Difficulty walking, Impaired flexibility  Visit Diagnosis: Muscle weakness (generalized)  Other abnormalities of gait and mobility  Other symptoms and signs involving the nervous system     Problem List Patient Active Problem List   Diagnosis Date Noted  . UTI (urinary tract infection) 03/21/2017  . Pyonephrosis 03/06/2017  . Sepsis ([place]) 03/06/2017  . ALS (amyotrophic lateral sclerosis) ([place]) 03/06/2017    Karrine Kluttz , PT 05/05/2017, 4:42 PM  [facility] [address] [place], [place], [address] Phone: [phone]   Fax:  [phone]  Name: Rocklyn Mayberry MRN: [id number] Date of Birth: 12-Dec-1955

## 2017-05-11 DIAGNOSIS — D485 Neoplasm of uncertain behavior of skin: Secondary | ICD-10-CM | POA: Diagnosis not present

## 2017-05-15 ENCOUNTER — Telehealth: Payer: Self-pay | Admitting: Rehabilitative and Restorative Service Providers"

## 2017-05-15 NOTE — Telephone Encounter (Signed)
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  The patient would benefit from occupational therapy evaluation for hand weakness and ADLs.   If you agree, please place an order in [facility] workque in [facility] or fax the order to [phone]. Thank you, [name], [name] [address] [name], [name]  [address] Phone:  [phone] Fax:  [phone]

## 2017-05-18 ENCOUNTER — Ambulatory Visit: Payer: BLUE CROSS/BLUE SHIELD | Attending: Neurology | Admitting: Physical Therapy

## 2017-05-18 DIAGNOSIS — R29818 Other symptoms and signs involving the nervous system: Secondary | ICD-10-CM | POA: Insufficient documentation

## 2017-05-18 DIAGNOSIS — M6281 Muscle weakness (generalized): Secondary | ICD-10-CM | POA: Diagnosis present

## 2017-05-18 DIAGNOSIS — R2689 Other abnormalities of gait and mobility: Secondary | ICD-10-CM | POA: Insufficient documentation

## 2017-05-18 NOTE — Patient Instructions (Signed)
Hip Abduction / Adduction: with Knee Flexion (Supine)    With right knee bent, gently lower knee to side and return. Repeat __1__ times per set. Do _1-2___ sets per session. Do 10 reps.  (GREEN BAND)  KNEE: Flexion - Prone    Bend knee. Raise heel toward buttocks. Do not raise hips. _10__ reps per set, _1__ sets per day, _5__ days per week CAN USE RED BAND _ PLACE AROUND BOTH ANKLES  (RED BAND)  BEND BOTH KNEES -- PLACE THERABAND (GREEN) around BOTH KNEES:  Keep left leg still and move right leg out to side - hold 3-5 reps; 10 reps  http://orth.exer.us/683   Knee Flexion: Resisted (Sitting)    Sit with band under left foot and looped around ankle of supported leg. Pull unsupported leg back. Repeat __10__ times per set. Do _1___ sets per session. Do __1__ sessions per day.  (GREEN BAND)  http://orth.exer.us/695   Copyright  VHI. All rights reserved.  Knee Extension: Resisted (Sitting)    With band looped around right ankle and under other foot, straighten leg with ankle loop. Keep other leg bent to increase resistance. Repeat __10__ times per set. Do __1__ sets per session. Do __1__ sessions per day.  (RED BAND)  http://orth.exer.us/691   Copyright  VHI. All rights reserved.

## 2017-05-18 NOTE — Therapy (Signed)
[facility] [address] [place], [place], [address] Phone: [phone]   Fax:  [phone]  Physical Therapy Treatment  Patient Details  Name: Laurie Wise MRN: [id number] Date of Birth: 17-Jan-1956 Referring Provider: Rolena Infante, MD  Encounter Date: 05/18/2017      PT End of Session - 05/18/17 1734    Visit Number 2   Number of Visits 17   Date for PT Re-Evaluation 07/04/17   Authorization Type private insurance   PT Start Time [phone]   PT Stop Time 0931   PT Time Calculation (min) 44 min      Past Medical History:  Diagnosis Date  . Ductal carcinoma in situ (DCIS) of left breast  [facility] in [place]--- per pt no recurrence   dx s/p  left breast lumpectomy and radiation  . History of acute pyelonephritis 03/06/2017   w/ sepsis due to ureteral stone obstruction  . History of radiation therapy 2016   left breast   . Left ureteral stone   . 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 Nephrolithiasis    bilateral non-obstructive per CT 03-06-2017  . OSA on CPAP    setting 8  . Sensation of pressure in bladder area   . Tachycardia    taking beta blocker  . Uses walker   . Wears glasses     Past Surgical History:  Procedure Laterality Date  . BREAST LUMPECTOMY Left 2016   [facility] in [place]   DCIS-lumpectomy  . CESAREAN SECTION  1990  . CYSTOSCOPY WITH STENT PLACEMENT Right 03/06/2017   Procedure: CYSTOSCOPY WITH RIGHT STENT PLACEMENT;  Surgeon: Franchot Gallo, MD;  Location: [facility];  Service: Urology;  Laterality: Right;  . CYSTOSCOPY/URETEROSCOPY/HOLMIUM LASER/STENT PLACEMENT Right 04/03/2017   Procedure: CYSTOSCOPY/URETEROSCOPY/HOLMIUM LASER/STONE EXTRACTION/STENT REPLACEMENT;  Surgeon: Franchot Gallo, MD;  Location: [facility];  Service: Urology;  Laterality: Right;  . LAMINECTOMY  2003   L4-5  . PORTA CATH INSERTION  08/2016   power port for ALS IV  medication    There were no vitals filed for this visit.      Subjective Assessment - 05/18/17 1655    Subjective Pt amb. 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   Patient Stated Goals "Overall strength, and in that process learn how to get up off the floor."  Also recently obtained a separate knee brace after trying KAFO *wants to look at options.   Currently in Pain? No/denies                         [facility] Adult PT Treatment/Exercise - 05/18/17 0903      Transfers   Transfers Sit to Stand   Sit to Stand 5: Supervision   Number of Reps Other reps (comment)  3   Comments no UE support used - performed from hi/lo mat table     Ambulation/Gait   Ambulation/Gait Yes   Ambulation/Gait Assistance 6: Modified independent (Device/Increase time)   Ambulation Distance (Feet) 100 Feet   Assistive device --  2 SPC's   Gait Pattern --  Rt knee hyperextension in stance   Ambulation Surface Level;Indoor     Exercises   Exercises Lumbar;Knee/Hip;Ankle     Knee/Hip  Exercises: Seated   Long Arc Quad Strengthening;Right;1 set;10 reps  red theraband used   Hamstring Curl Strengthening;Right;1 set;10 reps  red theraband used      Knee/Hip Exercises: Supine   Heel Slides Right;1 set;10 reps  with manual moderate resistance   Bridges AROM;Both;1 set;10 reps   Bridges with Clamshell 1 set;10 reps   Single Leg Bridge Right;Strengthening;10 reps   Other Supine Knee/Hip Exercises Pt performed bridging with LLE extension, bridging with marching (attempted as difficult)    Other Supine Knee/Hip Exercises Pt performed Rt hip abduction with red theraband 10 reps     Knee/Hip Exercises: Sidelying   Hip ABduction AROM;Right;1 set;10 reps     Knee/Hip Exercises: Prone   Hamstring Curl 1 set;10 reps    Hip Extension AROM;Right;1 set;5 reps   Other Prone Exercises Rt hip extension with flexed knee     Ankle Exercises: Stretches   Gastroc Stretch 1 rep;30 seconds  passively with knee extended                PT Education - 05/18/17 1731    Education provided Yes   Education Details 4 exercises given for HEP (no pictures given due to time constraint) _ plan to give pics next session   Person(s) Educated Patient   Methods Explanation;Demonstration  HANDOUT TO BE PRINTED AND GIVEN TO PT NEXT SESSION   Comprehension Verbalized understanding          PT Short Term Goals - 05/05/17 0732      PT SHORT TERM GOAL #1   Title The patient will return demo HEP for flexibility and maintenance of strength.   Time 4   Period Weeks   Target Date 06/04/17     PT SHORT TERM GOAL #2   Title The patient will be further assessed on bed mobility and LTG to follow (currently notes crawling on hands/knees and "flopping" over).   Time 4   Period Weeks   Target Date 06/04/17     PT SHORT TERM GOAL #3   Title The patient will improve gait speed from 2.05 ft/sec to 2.5 ft/sec utilizing more supportive assitive device and R AFO/knee brace to improve stability.    Time 4   Period Weeks   Target Date 06/04/17     PT SHORT TERM GOAL #4   Title The patient will move floor<>stand with UE support and min A   Time 4   Period Weeks   Target Date 06/04/17           PT Long Term Goals - 05/05/17 0901      PT LONG TERM GOAL #1   Title The patient will verbalize understanding of community welllness for post d/c.   Time 8   Period Weeks   Target Date 07/04/17     PT LONG TERM GOAL #2   Title The patient will have R LE bracing needs met to improve energy efficiency and safety with gait activities.   Time 8   Period Weeks   Target Date 07/04/17     PT LONG TERM GOAL #3   Title The patient will ambulate x 800 ft with RW (if willing to consider for longer community outings) mod indep to  improve community access.   Time 8   Period Weeks   Target Date 07/04/17     PT LONG TERM GOAL #4   Title The patient will move floor<>stand with UE support modified indep.   Time 8  Period Weeks   Target Date 07/04/17     PT LONG TERM GOAL #5   Title Bed mobility goal to follow.   Time 8   Period Weeks   Target Date 07/04/17               Plan - 05/18/17 1735    Clinical Impression Statement Pt would benefit from an orthosis to reduce/control Rt genu recurvatum; pt demonstrates decr. trunk/UE strength as pt had moderate difficulty with transferring sidelying to sitting.  Pt also demonstrates decr. muscle endurance as pt able to perform 3-4 reps of Rt knee flexion in prone position with use of red theraband prior to fatigue with only approx. 1/3 AROM achieved after initial 4 reps.     Rehab Potential Good   Clinical Impairments Affecting Rehab Potential progressive neuromuscular disease   PT Frequency 2x / week   PT Duration 8 weeks   PT Treatment/Interventions ADLs/Self Care Home Management;Functional mobility training;Gait training;Therapeutic activities;Therapeutic exercise;Balance training;Neuromuscular re-education;Patient/family education;Orthotic Fit/Training;Manual techniques   PT Next Visit Plan Look for OT order; please give pictures of exercises instructed to do for HEP (see 05-18-17 note) as pics not given today due to time constraint:  assess trunk strength - pt had difficult time with sidelying to sitting   PT Home Exercise Plan hip abdct in hooklying, knee flexion prone and seated, LAQ with red theraband   Consulted and Agree with Plan of Care Patient      Patient will benefit from skilled therapeutic intervention in order to improve the following deficits and impairments:  Abnormal gait, Decreased balance, Decreased activity tolerance, Decreased strength, Difficulty walking, Impaired flexibility  Visit Diagnosis: Muscle weakness (generalized)  Other  abnormalities of gait and mobility     Problem List Patient Active Problem List   Diagnosis Date Noted  . UTI (urinary tract infection) 03/21/2017  . Pyonephrosis 03/06/2017  . Sepsis ([place]) 03/06/2017  . ALS (amyotrophic lateral sclerosis) ([place]) 03/06/2017    Tawnie Ehresman, [place], PT 05/18/2017, 5:46 PM  [facility] [address] [place], [place], [address] Phone: [phone]   Fax:  [phone]  Name: Anaysia Germer MRN: [id number] Date of Birth: 1956-02-07

## 2017-05-24 ENCOUNTER — Ambulatory Visit: Payer: BLUE CROSS/BLUE SHIELD | Admitting: Rehabilitative and Restorative Service Providers"

## 2017-05-24 DIAGNOSIS — R29818 Other symptoms and signs involving the nervous system: Secondary | ICD-10-CM

## 2017-05-24 DIAGNOSIS — M6281 Muscle weakness (generalized): Secondary | ICD-10-CM | POA: Diagnosis not present

## 2017-05-24 DIAGNOSIS — R2689 Other abnormalities of gait and mobility: Secondary | ICD-10-CM

## 2017-05-24 NOTE — Patient Instructions (Signed)
    Straight Leg Raise    Tighten stomach and slowly raise locked right leg __6__ inches from floor.  TOES POINT TO THE CEILING (NO ROLLING OUT). Repeat _10-15___ times per set. Do __1__ sets per session. Do __1-2__ sessions per day.  http://orth.exer.us/1103   Copyright  VHI. All rights reserved.   KNEE: Flexion - Prone    Bend knee. Raise heel toward buttocks. Do not raise hips. _10__ reps per set, _1__ sets per day, _5__ days per week  No BAND to begin    Knee Flexion: Resisted (Sitting)    Sit with band under left foot and looped around ankle of supported leg. Pull unsupported leg back. Repeat __10__ times per set. Do _1___ sets per session. Do __1__ sessions per day.  (RED BAND)  http://orth.exer.us/695   Copyright  VHI. All rights reserved.  Knee Extension: Resisted (Sitting)    With band looped around right ankle and under other foot, straighten leg with ankle loop. Keep other leg bent to increase resistance. Repeat __10__ times per set. Do __1__ sets per session. Do __1__ sessions per day.  BEGIN WITH NO BAND AND WORK ON FULL RANGE OF MOTION.  http://orth.exer.us/691   Copyright  VHI. All rights reserved.

## 2017-05-24 NOTE — Therapy (Signed)
[facility] [address] [place], [place], [address] Phone: [phone]   Fax:  [phone]  Physical Therapy Treatment  Patient Details  Name: Laurie Wise MRN: [id number] Date of Birth: Jun 02, 1956 Referring Provider: Rolena Infante, MD   Encounter Date: 05/24/2017  PT End of Session - 05/24/17 1100    Visit Number  3    Number of Visits  17    Date for PT Re-Evaluation  07/04/17    Authorization Type  private insurance    PT Start Time  [phone]    PT Stop Time  0935    PT Time Calculation (min)  45 min    Activity Tolerance  Patient tolerated treatment well    Behavior During Therapy  [facility] for tasks assessed/performed       Past Medical History:  Diagnosis Date  . Ductal carcinoma in situ (DCIS) of left breast  [facility] in [place]--- per pt no recurrence   dx s/p  left breast lumpectomy and radiation  . History of acute pyelonephritis 03/06/2017   w/ sepsis due to ureteral stone obstruction  . History of radiation therapy 2016   left breast   . Left ureteral stone   . 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 Nephrolithiasis    bilateral non-obstructive per CT 03-06-2017  . OSA on CPAP    setting 8  . Sensation of pressure in bladder area   . Tachycardia    taking beta blocker  . Uses walker   . Wears glasses     Past Surgical History:  Procedure Laterality Date  . BREAST LUMPECTOMY Left 2016   [facility] [facility] in [place]   DCIS-lumpectomy  . CESAREAN SECTION  1990  . LAMINECTOMY  2003   L4-5  . PORTA CATH INSERTION  08/2016   power port for ALS IV medication    There were no vitals filed for this visit.  Subjective Assessment - 05/24/17 0856    Subjective  The patient brought knee brace today stating "I'm not ever going to wear this."  She reports her current AFO is 47 months old (had one like it prior ot that).     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    Patient Stated Goals  "Overall strength, and in that process learn how to get up off the floor."  Also recently obtained a separate knee brace after trying KAFO *wants to look at options.    Currently in Pain?  No/denies                      [facility] Adult PT Treatment/Exercise - 05/24/17 0859      Self-Care   Self-Care  Other Self-Care Comments    Other Self-Care Comments   discussed bracing and showed patient picture of GRAFO (still concerned about knee recurvatum, however PT described fixing knee by fixing ankle joint.  Discussed KAFO that patient given--she will bring next visit.      Exercises   Exercises  Other Exercises    Other Exercises   SUPINE:  straight leg raise with cues on toe remaining at neutral (not turning out), supine rolling for core stabilization x 5 reps to each side, supine marching x 10 reps, supine hip ab/adduction (*this was easy today with red band--did not give for HEP).  PRONE:  knee flexion x 10 reps (began with no weight), prone hip  extension x 10 reps.  SEATED:  knee flexion with red band x 10 reps with cues on technique.  LE extension with red band **unable to lift through available ROM.  Therefore, did without band.              PT Education - 05/24/17 1059    Education provided  Yes    Education Details  HEP: PWR rolling for core (and getting situated in bed), SLR, seated knee flexion/extension, prone knee flexion    Person(s) Educated  Patient    Methods  Explanation;Demonstration;Handout    Comprehension  Returned demonstration;Verbalized understanding       PT Short Term Goals - 05/05/17 0732      PT SHORT TERM GOAL #1   Title  The patient will return demo HEP for flexibility and maintenance of strength.    Time  4    Period  Weeks    Target Date  06/04/17      PT SHORT TERM GOAL #2   Title  The patient will be further assessed on bed mobility and LTG to follow (currently notes crawling on  hands/knees and "flopping" over).    Time  4    Period  Weeks    Target Date  06/04/17      PT SHORT TERM GOAL #3   Title  The patient will improve gait speed from 2.05 ft/sec to 2.5 ft/sec utilizing more supportive assitive device and R AFO/knee brace to improve stability.     Time  4    Period  Weeks    Target Date  06/04/17      PT SHORT TERM GOAL #4   Title  The patient will move floor<>stand with UE support and min A    Time  4    Period  Weeks    Target Date  06/04/17        PT Long Term Goals - 05/05/17 0901      PT LONG TERM GOAL #1   Title  The patient will verbalize understanding of community welllness for post d/c.    Time  8    Period  Weeks    Target Date  07/04/17      PT LONG TERM GOAL #2   Title  The patient will have R LE bracing needs met to improve energy efficiency and safety with gait activities.    Time  8    Period  Weeks    Target Date  07/04/17      PT LONG TERM GOAL #3   Title  The patient will ambulate x 800 ft with RW (if willing to consider for longer community outings) mod indep to improve community access.    Time  8    Period  Weeks    Target Date  07/04/17      PT LONG TERM GOAL #4   Title  The patient will move floor<>stand with UE support modified indep.    Time  8    Period  Weeks    Target Date  07/04/17      PT LONG TERM GOAL #5   Title  Bed mobility goal to follow.    Time  8    Period  Weeks    Target Date  07/04/17            Plan - 05/24/17 1101    Clinical Impression Statement  The patient has established HEP.  She notes that she has to  sit up at home to roll in the middle of the night. PT to begin working on bed moblity/trunk support.  PT to continue working through bracing options with patient.    PT Treatment/Interventions  ADLs/Self Care Home Management;Functional mobility training;Gait training;Therapeutic activities;Therapeutic exercise;Balance training;Neuromuscular re-education;Patient/family  education;Orthotic Fit/Training;Manual techniques    PT Next Visit Plan  Check chart ? call about OT order?; review HEP as needed; core training for rolling and bed mobility; orthotics checkout? consider options for knee control    Consulted and Agree with Plan of Care  Patient       Patient will benefit from skilled therapeutic intervention in order to improve the following deficits and impairments:  Abnormal gait, Decreased balance, Decreased activity tolerance, Decreased strength, Difficulty walking, Impaired flexibility  Visit Diagnosis: Muscle weakness (generalized)  Other abnormalities of gait and mobility  Other symptoms and signs involving the nervous system     Problem List Patient Active Problem List   Diagnosis Date Noted  . UTI (urinary tract infection) 03/21/2017  . Pyonephrosis 03/06/2017  . Sepsis ([place]) 03/06/2017  . ALS (amyotrophic lateral sclerosis) ([place]) 03/06/2017    Renette Hsu, PT 05/24/2017, 11:04 AM  [facility] [address] [place], [place], [address] Phone: [phone]   Fax:  [phone]  Name: Laurie Wise MRN: [id number] Date of Birth: 1955-11-11

## 2017-05-26 ENCOUNTER — Encounter: Payer: Self-pay | Admitting: Rehabilitative and Restorative Service Providers"

## 2017-05-26 ENCOUNTER — Ambulatory Visit: Payer: BLUE CROSS/BLUE SHIELD | Admitting: Rehabilitative and Restorative Service Providers"

## 2017-05-26 DIAGNOSIS — R29818 Other symptoms and signs involving the nervous system: Secondary | ICD-10-CM

## 2017-05-26 DIAGNOSIS — R2689 Other abnormalities of gait and mobility: Secondary | ICD-10-CM

## 2017-05-26 DIAGNOSIS — M6281 Muscle weakness (generalized): Secondary | ICD-10-CM

## 2017-05-26 NOTE — Therapy (Signed)
[facility] [address] [place], [place], [address] Phone: [phone]   Fax:  [phone]  Physical Therapy Treatment  Patient Details  Name: Laurie Wise MRN: [id number] Date of Birth: [birth year] Referring Provider: Rolena Infante, MD   Encounter Date: 05/26/2017  PT End of Session - 05/26/17 0855    Visit Number  4    Number of Visits  17    Date for PT Re-Evaluation  07/04/17    Authorization Type  private insurance    PT Start Time  [phone]    PT Stop Time  0930    PT Time Calculation (min)  42 min    Activity Tolerance  Patient tolerated treatment well    Behavior During Therapy   for tasks assessed/performed       Past Medical History:  Diagnosis Date  . Ductal carcinoma in situ (DCIS) of left breast  [facility] in [place]--- per pt no recurrence   dx s/p  left breast lumpectomy and radiation  . History of acute pyelonephritis 03/06/2017   w/ sepsis due to ureteral stone obstruction  . History of radiation therapy 2016   left breast   . Left ureteral stone   . Leta Baptist disease [facility]   . Nephrolithiasis    bilateral non-obstructive per CT 03-06-2017  . OSA on CPAP    setting 8  . Sensation of pressure in bladder area   . Tachycardia    taking beta blocker  . Uses walker   . Wears glasses     Past Surgical History:  Procedure Laterality Date  . BREAST LUMPECTOMY Left 2016   [place] in [place]   DCIS-lumpectomy  . CESAREAN SECTION  1990  . LAMINECTOMY  2003   L4-5  . PORTA CATH INSERTION  08/2016   power port for ALS IV medication    There were no vitals filed for this visit.  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  She has not had a chance to do exercises.  The patient notes she fell last night after water aerobics when fatigued.  She got up  from chair and turned to the right and right knee buckled.  She did not hurt herself per report.     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    Patient Stated Goals  "Overall strength, and in that process learn how to get up off the floor."  Also recently obtained a separate knee brace after trying KAFO *wants to look at options.    Currently in Pain?  No/denies                      [facility] Adult PT Treatment/Exercise - 05/26/17 0927      Ambulation/Gait   Ambulation/Gait  Yes    Ambulation/Gait Assistance  6: Modified independent (Device/Increase time)    Ambulation Distance (Feet)  200 Feet    Assistive device  Straight cane    Ambulation Surface  Level;Indoor    Gait Comments  The patient and PT worked on gait in her "normal" pattern with R knee recurvatum, then worked on intentionally maintaining minimal R knee flexion.  She is able to control R knee if she thinks about each step.  This also improved as she used 2 canes.  Her  posture and trunk control were more midline with bilateral SPC use.      Exercises   Exercises  Other Exercises    Other Exercises   SEATED:  marching wide<>narrow R side x 15 reps, seated marching with core stabilization R and L sides dec'ing pressure through hands, seated marching with UE flexion (contralateral extremity) with cues on core engagement.  STANDING:  sidestepping in parallel bars 10 feet x 4 reps with UE support and cues for R knee flexion, standing heel cord stretch with UE support (gave for HEP), mini marches with bilateral UE support moving forward backwards x 4 reps in parallel bars, standing R and L knee flexion (cues for R knee stability x min A when lifting L leg), standing hip abduction L and R sides x 10 reps with UE support and R knee support.  Step ups onto 6" step with bilateral UE support L LE and press downs into 6" step with R side.              PT Education - 05/26/17  0927    Education provided  Yes    Education Details  HEP: standing heel cord stretch    Person(s) Educated  Patient    Methods  Explanation;Demonstration;Handout    Comprehension  Verbalized understanding;Returned demonstration       PT Short Term Goals - 05/05/17 0732      PT SHORT TERM GOAL #1   Title  The patient will return demo HEP for flexibility and maintenance of strength.    Time  4    Period  Weeks    Target Date  06/04/17      PT SHORT TERM GOAL #2   Title  The patient will be further assessed on bed mobility and LTG to follow (currently notes crawling on hands/knees and "flopping" over).    Time  4    Period  Weeks    Target Date  06/04/17      PT SHORT TERM GOAL #3   Title  The patient will improve gait speed from 2.05 ft/sec to 2.5 ft/sec utilizing more supportive assitive device and R AFO/knee brace to improve stability.     Time  4    Period  Weeks    Target Date  06/04/17      PT SHORT TERM GOAL #4   Title  The patient will move floor<>stand with UE support and min A    Time  4    Period  Weeks    Target Date  06/04/17        PT Long Term Goals - 05/05/17 0901      PT LONG TERM GOAL #1   Title  The patient will verbalize understanding of community welllness for post d/c.    Time  8    Period  Weeks    Target Date  07/04/17      PT LONG TERM GOAL #2   Title  The patient will have R LE bracing needs met to improve energy efficiency and safety with gait activities.    Time  8    Period  Weeks    Target Date  07/04/17      PT LONG TERM GOAL #3   Title  The patient will ambulate x 800 ft with RW (if willing to consider for longer community outings) mod indep to improve community access.    Time  8    Period  Weeks    Target  Date  07/04/17      PT LONG TERM GOAL #4   Title  The patient will move floor<>stand with UE support modified indep.    Time  8    Period  Weeks    Target Date  07/04/17      PT LONG TERM GOAL #5   Title  Bed mobility  goal to follow.    Time  8    Period  Weeks    Target Date  07/04/17            Plan - 05/26/17 1201    Clinical Impression Statement  The patient is able to control R knee recurvatum during gait with frequent cues.  Her posture is improved with 2 canes versus 1.  PT to continue progressing to STGs/ LTGs.   Spoke with Gerald Stabs @ [facility] after patient's session and he recommends we first assess all braces before consulting further.    PT Treatment/Interventions  ADLs/Self Care Home Management;Functional mobility training;Gait training;Therapeutic activities;Therapeutic exercise;Balance training;Neuromuscular re-education;Patient/family education;Orthotic Fit/Training;Manual techniques    PT Next Visit Plan  Review HEP as needed (she had not had time to perform yet), core training for rolling and bed mobility, * can you ask her to bring KAFO in for my next session?  LE strengthening.    Consulted and Agree with Plan of Care  Patient       Patient will benefit from skilled therapeutic intervention in order to improve the following deficits and impairments:  Abnormal gait, Decreased balance, Decreased activity tolerance, Decreased strength, Difficulty walking, Impaired flexibility  Visit Diagnosis: Muscle weakness (generalized)  Other abnormalities of gait and mobility  Other symptoms and signs involving the nervous system     Problem List Patient Active Problem List   Diagnosis Date Noted  . UTI (urinary tract infection) 03/21/2017  . Pyonephrosis 03/06/2017  . Sepsis ([place]) 03/06/2017  . ALS (amyotrophic lateral sclerosis) ([place]) 03/06/2017    Cristy Colmenares, PT 05/26/2017, 12:05 PM  [facility] [address] [place], [place], [address] Phone: [phone]   Fax:  [phone]  Name: Laurie Wise MRN: [id number] Date of Birth: March 06, 1956

## 2017-05-26 NOTE — Patient Instructions (Signed)
Heel Cord Stretch    Place one leg forward, bent, other leg behind and straight. Lean forward keeping back heel flat. Hold __30__ seconds while counting out loud. Repeat with other leg. Repeat __2__ times. Do __1-2__ sessions per day.  http://gt2.exer.us/512   Copyright  VHI. All rights reserved.

## 2017-05-29 ENCOUNTER — Encounter: Payer: Self-pay | Admitting: Rehabilitative and Restorative Service Providers"

## 2017-05-29 ENCOUNTER — Telehealth: Payer: Self-pay | Admitting: Rehabilitative and Restorative Service Providers"

## 2017-05-29 ENCOUNTER — Ambulatory Visit: Payer: BLUE CROSS/BLUE SHIELD | Admitting: Rehabilitative and Restorative Service Providers"

## 2017-05-29 DIAGNOSIS — R29818 Other symptoms and signs involving the nervous system: Secondary | ICD-10-CM

## 2017-05-29 DIAGNOSIS — M6281 Muscle weakness (generalized): Secondary | ICD-10-CM | POA: Diagnosis not present

## 2017-05-29 DIAGNOSIS — R2689 Other abnormalities of gait and mobility: Secondary | ICD-10-CM

## 2017-05-29 NOTE — Therapy (Signed)
[facility] [address] [place], [place], [address] Phone: [phone]   Fax:  [phone]  Physical Therapy Treatment  Patient Details  Name: Laurie Wise MRN: [id number] Date of Birth: Aug 27, 1955 Referring Provider: Rolena Infante, MD   Encounter Date: 05/29/2017  PT End of Session - 05/29/17 1244    Visit Number  5    Number of Visits  17    Date for PT Re-Evaluation  07/04/17    Authorization Type  private insurance    PT Start Time  1233    PT Stop Time  1315    PT Time Calculation (min)  42 min    Activity Tolerance  Patient tolerated treatment well    Behavior During Therapy  [facility] for tasks assessed/performed       Past Medical History:  Diagnosis Date  . Ductal carcinoma in situ (DCIS) of left breast  [facility] in [place]--- per pt no recurrence   dx s/p  left breast lumpectomy and radiation  . History of acute pyelonephritis 03/06/2017   w/ sepsis due to ureteral stone obstruction  . History of radiation therapy 2016   left breast   . Left ureteral stone   . 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 Nephrolithiasis    bilateral non-obstructive per CT 03-06-2017  . OSA on CPAP    setting 8  . Sensation of pressure in bladder area   . Tachycardia    taking beta blocker  . Uses walker   . Wears glasses     Past Surgical History:  Procedure Laterality Date  . BREAST LUMPECTOMY Left 2016   [facility] in [place]   DCIS-lumpectomy  . CESAREAN SECTION  1990  . LAMINECTOMY  2003   L4-5  . PORTA CATH INSERTION  08/2016   power port for ALS IV medication    There were no vitals filed for this visit.  Subjective Assessment - 05/29/17 1234    Subjective  The patient notes that she tried exercises and rolling in bed improved from one day to the next.  PT and patient discussed upcoming OT referral and orthotics referral.     Patient Stated Goals  "Overall strength, and in that  process learn how to get up off the floor."  Also recently obtained a separate knee brace after trying KAFO *wants to look at options.    Currently in Pain?  No/denies                      [facility] Adult PT Treatment/Exercise - 05/29/17 1246      Ambulation/Gait   Ambulation/Gait  Yes    Ambulation/Gait Assistance  6: Modified independent (Device/Increase time)    Ambulation Distance (Feet)  200 Feet    Assistive device  -- bilateral SPC    Ambulation Surface  Level;Indoor    Gait Comments  Decreased hip ER with two canes.        Self-Care   Self-Care  Other Self-Care Comments    Other Self-Care Comments   Discussed that PT would like for patient to bring KAFO and knee brace into clinic.  We need to assess her gait with current braces before further discussing options with orthotist in [place].       Exercises   Exercises  Other Exercises    Other Exercises   Bridges x 5 reps, rhythmic stabilization in bridge resisting UEs/LEs for core contraction; sidelying hip abduction  x 10 reps with some assist to lift  Seated core stability:  Reaching ball 90 degrees to overhead x 10 reps, leaning on elbow for side leaning and return to other side. Seated trunk rotation holding a ball with core stability.  Moving on mat quadriped to elbow and return to quadriped x 3 reps.  Quadriped working to move to 1/2 kneeling (just moved L foot by circumducting to bring to 1/2 kneel with bilat UEs still supporting).  *Working on beginning steps of floor transfers.              PT Education - 05/29/17 2205    Education provided  Yes    Education Details  patient and PT discussed questions regarding ongoing PT for maintenance.  PT discussed our role in addressing deficits and providing HEP for ongoing exercise.  Due to her condition, we could reassess intermittently to optimize her mobility.    Person(s) Educated  Patient    Methods  Explanation    Comprehension  Verbalized understanding        PT Short Term Goals - 05/05/17 0732      PT SHORT TERM GOAL #1   Title  The patient will return demo HEP for flexibility and maintenance of strength.    Time  4    Period  Weeks    Target Date  06/04/17      PT SHORT TERM GOAL #2   Title  The patient will be further assessed on bed mobility and LTG to follow (currently notes crawling on hands/knees and "flopping" over).    Time  4    Period  Weeks    Target Date  06/04/17      PT SHORT TERM GOAL #3   Title  The patient will improve gait speed from 2.05 ft/sec to 2.5 ft/sec utilizing more supportive assitive device and R AFO/knee brace to improve stability.     Time  4    Period  Weeks    Target Date  06/04/17      PT SHORT TERM GOAL #4   Title  The patient will move floor<>stand with UE support and min A    Time  4    Period  Weeks    Target Date  06/04/17        PT Long Term Goals - 05/05/17 0901      PT LONG TERM GOAL #1   Title  The patient will verbalize understanding of community welllness for post d/c.    Time  8    Period  Weeks    Target Date  07/04/17      PT LONG TERM GOAL #2   Title  The patient will have R LE bracing needs met to improve energy efficiency and safety with gait activities.    Time  8    Period  Weeks    Target Date  07/04/17      PT LONG TERM GOAL #3   Title  The patient will ambulate x 800 ft with RW (if willing to consider for longer community outings) mod indep to improve community access.    Time  8    Period  Weeks    Target Date  07/04/17      PT LONG TERM GOAL #4   Title  The patient will move floor<>stand with UE support modified indep.    Time  8    Period  Weeks    Target Date  07/04/17  PT LONG TERM GOAL #5   Title  Bed mobility goal to follow.    Time  8    Period  Weeks    Target Date  07/04/17            Plan - 05/29/17 2216    Clinical Impression Statement  The patient is performing HEP.  We discussed barriers with bed mobility at home and she has  elevated bed that make sitting on edge more challenging.  PT to replicate in clinic as able to work on Office manager.  Also plan to continue working on steps needed for floor transfer as this is a goal for the patinet.     PT Treatment/Interventions  ADLs/Self Care Home Management;Functional mobility training;Gait training;Therapeutic activities;Therapeutic exercise;Balance training;Neuromuscular re-education;Patient/family education;Orthotic Fit/Training;Manual techniques    PT Next Visit Plan  Begin to Check STGs due 11/18; observe patient with KAFO and knee brace +AFO.  core training for rolling and bed mobility; quadriped to begin determining best way to teach floor<>chair transfer    Consulted and Agree with Plan of Care  Patient       Patient will benefit from skilled therapeutic intervention in order to improve the following deficits and impairments:  Abnormal gait, Decreased balance, Decreased activity tolerance, Decreased strength, Difficulty walking, Impaired flexibility  Visit Diagnosis: Muscle weakness (generalized)  Other abnormalities of gait and mobility  Other symptoms and signs involving the nervous system     Problem List Patient Active Problem List   Diagnosis Date Noted  . UTI (urinary tract infection) 03/21/2017  . Pyonephrosis 03/06/2017  . Sepsis ([place]) 03/06/2017  . ALS (amyotrophic lateral sclerosis) ([place]) 03/06/2017    Oluwatobi Ruppe, PT 05/29/2017, 10:19 PM  [facility] [address] [place], [place], [address] Phone: [phone]   Fax:  [phone]  Name: Salimah Martinovich MRN: [id number] Date of Birth: 07-05-1956

## 2017-05-29 NOTE — Telephone Encounter (Signed)
Called MD office (Dr. Rolena Infante @ [facility]) and left message for Holley Raring (patient provided as contact at neuroscience office) requesting OT order.  Benaiah Behan, PT

## 2017-06-01 ENCOUNTER — Ambulatory Visit: Payer: BLUE CROSS/BLUE SHIELD | Admitting: Physical Therapy

## 2017-06-01 ENCOUNTER — Encounter: Payer: Self-pay | Admitting: Physical Therapy

## 2017-06-01 DIAGNOSIS — R2689 Other abnormalities of gait and mobility: Secondary | ICD-10-CM

## 2017-06-01 DIAGNOSIS — M6281 Muscle weakness (generalized): Secondary | ICD-10-CM | POA: Diagnosis not present

## 2017-06-01 NOTE — Therapy (Signed)
[facility] [address] [place], [place], [address] Phone: [phone]   Fax:  [phone]  Physical Therapy Treatment  Patient Details  Name: Laurie Wise  MRN: [id number] Date of Birth: 22-May-1956 Referring Provider: Rolena Infante, MD   Encounter Date: 06/01/2017  PT End of Session - 06/01/17 1335    Visit Number  6    Number of Visits  17    Date for PT Re-Evaluation  07/04/17    Authorization Type  private insurance    PT Start Time  [phone]    PT Stop Time  0935    PT Time Calculation (min)  44 min    Equipment Utilized During Treatment  Gait belt       Past Medical History:  Diagnosis Date  . Ductal carcinoma in situ (DCIS) of left breast  [facility] in [place]--- per pt no recurrence   dx s/p  left breast lumpectomy and radiation  . History of acute pyelonephritis 03/06/2017   w/ sepsis due to ureteral stone obstruction  . History of radiation therapy 2016   left breast   . Left ureteral stone   . 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 Nephrolithiasis    bilateral non-obstructive per CT 03-06-2017  . OSA on CPAP    setting 8  . Sensation of pressure in bladder area   . Tachycardia    taking beta blocker  . Uses walker   . Wears glasses     Past Surgical History:  Procedure Laterality Date  . BREAST LUMPECTOMY Left 2016   [facility] in [place]   DCIS-lumpectomy  . CESAREAN SECTION  1990  . CYSTOSCOPY WITH STENT PLACEMENT Right 03/06/2017   Procedure: CYSTOSCOPY WITH RIGHT STENT PLACEMENT;  Surgeon: Franchot Gallo, MD;  Location: [facility];  Service: Urology;  Laterality: Right;  . CYSTOSCOPY/URETEROSCOPY/HOLMIUM LASER/STENT PLACEMENT Right 04/03/2017   Procedure: CYSTOSCOPY/URETEROSCOPY/HOLMIUM LASER/STONE EXTRACTION/STENT REPLACEMENT;  Surgeon: Franchot Gallo, MD;  Location: [facility];  Service: Urology;  Laterality: Right;  . LAMINECTOMY  2003   L4-5  . PORTA CATH INSERTION  08/2016   power port for ALS IV medication    There were no vitals filed for this visit.  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    Patient Stated Goals  "Overall strength, and in that process learn how to get up off the floor."  Also recently obtained a separate knee brace after trying KAFO *wants to look at options.    Currently in Pain?  No/denies                      [facility] Adult PT Treatment/Exercise - 06/01/17 0850      Therapeutic Activites    Therapeutic Activities  Other Therapeutic Activities floor to stand transfer performed with bil. UE support       Lumbar Exercises: Quadruped   Other Quadruped Lumbar Exercises  Performed lifting UE's 5 times each, lifting each leg straight back 5 reps each; modified push ups in quadruped position 10 reps ; sitting back on heels x  3 reps with arms extended for stretching trunk       Knee/Hip Exercises: Aerobic   Nustep  10" level 4 - primarily with LE's only no charge as unsupervised      NeuroRe-ed:  Tall kneeling position on floor- partial squats 10 reps with cues to squeeze buttocks together during return to tall kneeling  Core stabilization exercise - lifting each UE 5 reps, then hands together lifting up 5 reps; trunk rotation 5 reps to Right and to left sides  Amb. In tall kneeling sideways with UE support on mat prn - on red mat - 4 reps length of mat Amb. In tall kneeling forwards with LUE support on mat prn and then backwards with CGA to min assist with balance - 2 reps on mat    Discussed Swedish knee cage as possible option in controlling Rt knee hyperextension but  informed pt that this brace would not prevent  Rt knee from buckling; pt requesting brace similar to ACL brace as she saw on person in community         PT Short Term Goals - 06/01/17 0925      PT SHORT TERM GOAL #1   Title  The patient will return demo HEP for flexibility and maintenance of strength.    Baseline  met 06-01-17    Status  Achieved      PT SHORT TERM GOAL #2   Title  The patient will be further assessed on bed mobility and LTG to follow (currently notes crawling on hands/knees and "flopping" over).      PT SHORT TERM GOAL #3   Title  The patient will improve gait speed from 2.05 ft/sec to 2.5 ft/sec utilizing more supportive assitive device and R AFO/knee brace to improve stability.       PT SHORT TERM GOAL #4   Title  The patient will move floor<>stand with UE support and min A    Baseline  met 06-01-17    Status  Achieved        PT Long Term Goals - 05/05/17 0901      PT LONG TERM GOAL #1   Title  The patient will verbalize understanding of community welllness for post d/c.    Time  8    Period  Weeks    Target Date  07/04/17      PT LONG TERM GOAL #2   Title  The patient will have R LE bracing needs met to improve energy efficiency and safety with gait activities.    Time  8    Period  Weeks    Target Date  07/04/17      PT LONG TERM GOAL #3   Title  The patient will ambulate x 800 ft with RW (if willing to consider for longer community outings) mod indep to improve community access.    Time  8    Period  Weeks    Target Date  07/04/17      PT LONG TERM GOAL #4   Title  The patient will move floor<>stand with UE support modified indep.    Time  8    Period  Weeks    Target Date  07/04/17      PT LONG TERM GOAL #5   Title  Bed mobility goal to follow.    Time  8    Period  Weeks    Target Date  07/04/17  Plan - 06/01/17 1336    Clinical Impression Statement  Pt able to perform floor to stand transfer much easier with  use of stool to gradually decrease height from floor to mat surface in performing floor to stand transfer; pt is unable to push up into standing position due to weak hip extensors and weak quadriceps.  Use of stool significantly increased ease with transfer - would be more beneficial if stool had a padded covering due to excessive pressure/weight bearing on LE with transfer. Pt requests to try a brace that will prevent hyperextension of Rt knee - continues to report that she is not going to wear the KAFO.    Rehab Potential  Good    Clinical Impairments Affecting Rehab Potential  progressive neuromuscular disease    PT Frequency  2x / week    PT Duration  8 weeks    PT Treatment/Interventions  ADLs/Self Care Home Management;Functional mobility training;Gait training;Therapeutic activities;Therapeutic exercise;Balance training;Neuromuscular re-education;Patient/family education;Orthotic Fit/Training;Manual techniques    PT Next Visit Plan   Check remaining STG's #2 and 3; ; observe patient with KAFO and knee brace +AFO.  core training for rolling and bed mobility    PT Home Exercise Plan  hip abdct in hooklying, knee flexion prone and seated, LAQ with red theraband    Recommended Other Services  trial of Swedish Knee cage?    Consulted and Agree with Plan of Care  Patient       Patient will benefit from skilled therapeutic intervention in order to improve the following deficits and impairments:  Abnormal gait, Decreased balance, Decreased activity tolerance, Decreased strength, Difficulty walking, Impaired flexibility  Visit Diagnosis: Other abnormalities of gait and mobility  Muscle weakness (generalized)     Problem List Patient Active Problem List   Diagnosis Date Noted  . UTI (urinary tract infection) 03/21/2017  . Pyonephrosis 03/06/2017  . Sepsis ([place]) 03/06/2017  . ALS (amyotrophic lateral sclerosis) ([place]) 03/06/2017    , [place], PT 06/01/2017, 1:47 PM  [facility] [address] [place], [place], [address] Phone: [phone]   Fax:  [phone]  Name: Abigaelle Verley MRN: [id number] Date of Birth: 1956-07-14

## 2017-06-05 ENCOUNTER — Ambulatory Visit: Payer: BLUE CROSS/BLUE SHIELD | Admitting: Rehabilitative and Restorative Service Providers"

## 2017-06-05 ENCOUNTER — Encounter: Payer: Self-pay | Admitting: Rehabilitative and Restorative Service Providers"

## 2017-06-05 DIAGNOSIS — M6281 Muscle weakness (generalized): Secondary | ICD-10-CM | POA: Diagnosis not present

## 2017-06-05 DIAGNOSIS — R2689 Other abnormalities of gait and mobility: Secondary | ICD-10-CM

## 2017-06-05 DIAGNOSIS — R29818 Other symptoms and signs involving the nervous system: Secondary | ICD-10-CM

## 2017-06-05 NOTE — Therapy (Signed)
[facility] [address] [place], [place], [address] Phone: [phone]   Fax:  [phone]  Physical Therapy Treatment  Patient Details  Name: Laurie Wise MRN: [id number] Date of Birth: June 14, 1956 Referring Provider: Rolena Infante, MD   Encounter Date: 06/05/2017  PT End of Session - 06/05/17 0854    Visit Number  7    Number of Visits  17    Date for PT Re-Evaluation  07/04/17    Authorization Type  private insurance    PT Start Time  [phone]    PT Stop Time  0930    PT Time Calculation (min)  42 min    Equipment Utilized During Treatment  Gait belt       Past Medical History:  Diagnosis Date  . Ductal carcinoma in situ (DCIS) of left breast  [facility] in [place]--- per pt no recurrence   dx s/p  left breast lumpectomy and radiation  . History of acute pyelonephritis 03/06/2017   w/ sepsis due to ureteral stone obstruction  . History of radiation therapy 2016   left breast   . Left ureteral stone   . 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 Nephrolithiasis    bilateral non-obstructive per CT 03-06-2017  . OSA on CPAP    setting 8  . Sensation of pressure in bladder area   . Tachycardia    taking beta blocker  . Uses walker   . Wears glasses     Past Surgical History:  Procedure Laterality Date  . BREAST LUMPECTOMY Left 2016   [facility] in [place]   DCIS-lumpectomy  . CESAREAN SECTION  1990  . CYSTOSCOPY WITH RIGHT STENT PLACEMENT Right 03/06/2017   Per[facility]anchot [facility] at [facility]  . CYSTOSCOPY/URETEROSCOPY/HOLMIUM LASER/STONE EXTRACTION/STENT REPLACEMENT Right 04/03/2017   Per[facility] at [facility]  . LAMINECTOMY  2003   L4-5  . PORTA CATH INSERTION  08/2016   power port for ALS IV medication    There were no vitals filed for this visit.  Subjective Assessment - 06/05/17 0853    Subjective  The patient reports that  she doesn't do HEP all at once.  The patient notes she and her husband are moving back to [place] in December.    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    Patient Stated Goals  "Overall strength, and in that process learn how to get up off the floor."  Also recently obtained a separate knee brace after trying KAFO *wants to look at options.    Currently in Pain?  No/denies                      [facility] Adult PT Treatment/Exercise - 06/05/17 0857      Ambulation/Gait   Ambulation/Gait  Yes    Ambulation/Gait Assistance  6: Modified independent (Device/Increase time)    Ambulation Distance (Feet)  100 Feet    Assistive device  Straight cane x 2    Ambulation Surface  Level;Indoor    Gait Comments  ORTHOTICS/PROSTHETICS CHECK OUT:  Patient initially walked with AFO and 2 canes x 100 ft reducing external rotation of the hip, then trialed R AFO with soft/velcro knee brace.  On knee brace, initially locked at 10 deg ext and 45 deg flexion to prevent knee buckling during gait, however patient was not able to  clear foot during swing phase.  Therefore, moved to 60 degrees flexion and 20 degrees extension.  Then trialed with KAFO donned.  Patient needed assist to donn, she ambulated with it locked in extension using hip flexion/hike to clear leg.  PT used in clinic ipad for video feedback.  She still expresses that we have not found a good combination to control the knee, allow her to drive, and that she can donn.  Also resistant to bulky brace (knee brace with AFO is bulky for her).       Self-Care   Self-Care  Other Self-Care Comments    Other Self-Care Comments   Discussed braces that we trialed today and plan to discuss further.  May need to f/u with orthotist in [place] as patient anticipates she will be moving back within 1-2 months.               PT Short Term Goals - 06/05/17 1154      PT SHORT TERM GOAL #1   Title   The patient will return demo HEP for flexibility and maintenance of strength.    Baseline  met 06-01-17    Status  Achieved      PT SHORT TERM GOAL #2   Title  The patient will be further assessed on bed mobility and LTG to follow (currently notes crawling on hands/knees and "flopping" over).    Status  On-going    Target Date  06/04/17      PT SHORT TERM GOAL #3   Title  The patient will improve gait speed from 2.05 ft/sec to 2.5 ft/sec utilizing more supportive assitive device and R AFO/knee brace to improve stability.     Status  On-going    Target Date  06/04/17      PT SHORT TERM GOAL #4   Title  The patient will move floor<>stand with UE support and min A    Baseline  met 06-01-17    Status  Achieved        PT Long Term Goals - 05/05/17 0901      PT LONG TERM GOAL #1   Title  The patient will verbalize understanding of community welllness for post d/c.    Time  8    Period  Weeks    Target Date  07/04/17      PT LONG TERM GOAL #2   Title  The patient will have R LE bracing needs met to improve energy efficiency and safety with gait activities.    Time  8    Period  Weeks    Target Date  07/04/17      PT LONG TERM GOAL #3   Title  The patient will ambulate x 800 ft with RW (if willing to consider for longer community outings) mod indep to improve community access.    Time  8    Period  Weeks    Target Date  07/04/17      PT LONG TERM GOAL #4   Title  The patient will move floor<>stand with UE support modified indep.    Time  8    Period  Weeks    Target Date  07/04/17      PT LONG TERM GOAL #5   Title  Bed mobility goal to follow.    Time  8    Period  Weeks    Target Date  07/04/17            Plan -  06/05/17 1208    Clinical Impression Statement  The patient has met 2/4 STGs.  Did not check STGs today as our session focused on trialing all brace options that patient currently has.  With R AFO, she has extensive R knee recurvatum.  With R AFO + knee  brace, she c/o that it is too bulky.  We had to adjust range to ensure foot would clear the floor.  She also has difficulty donning due to hand weakness.  With R KAFO, we had to walk in locked position to prevent buckling.  She also needed help to donn KAFO.     PT Treatment/Interventions  ADLs/Self Care Home Management;Functional mobility training;Gait training;Therapeutic activities;Therapeutic exercise;Balance training;Neuromuscular re-education;Patient/family education;Orthotic Fit/Training;Manual techniques    PT Next Visit Plan   Check remaining STG's #2 and 3;   core training for rolling and bed mobility; continue working to [place].     Consulted and Agree with Plan of Care  Patient       Patient will benefit from skilled therapeutic intervention in order to improve the following deficits and impairments:  Abnormal gait, Decreased balance, Decreased activity tolerance, Decreased strength, Difficulty walking, Impaired flexibility  Visit Diagnosis: Other abnormalities of gait and mobility  Muscle weakness (generalized)  Other symptoms and signs involving the nervous system     Problem List Patient Active Problem List   Diagnosis Date Noted  . UTI (urinary tract infection) 03/21/2017  . Pyonephrosis 03/06/2017  . Sepsis ([place]) 03/06/2017  . ALS (amyotrophic lateral sclerosis) ([place]) 03/06/2017    Moussa Wiegand, PT 06/05/2017, 12:11 PM  [facility] [address] [place], [place], [address] Phone: [phone]   Fax:  [phone]  Name: Laurie Wise MRN: [id number] Date of Birth: [birth year]

## 2017-06-06 ENCOUNTER — Ambulatory Visit: Payer: BLUE CROSS/BLUE SHIELD | Admitting: Physical Therapy

## 2017-06-06 ENCOUNTER — Ambulatory Visit: Payer: BLUE CROSS/BLUE SHIELD | Admitting: Occupational Therapy

## 2017-06-06 DIAGNOSIS — M6281 Muscle weakness (generalized): Secondary | ICD-10-CM | POA: Diagnosis not present

## 2017-06-06 DIAGNOSIS — R2689 Other abnormalities of gait and mobility: Secondary | ICD-10-CM

## 2017-06-06 DIAGNOSIS — R29818 Other symptoms and signs involving the nervous system: Secondary | ICD-10-CM

## 2017-06-06 NOTE — Therapy (Signed)
[facility] [address] [place], [place], [address] Phone: [phone]   Fax:  [phone]  Occupational Therapy Evaluation  Patient Details  Name: Laurie Wise MRN: [id number] Date of Birth: [birth year] Referring Provider: Dr. Creig Hines, Dr. Rolena Infante   Encounter Date: 06/06/2017  OT End of Session - 06/06/17 1112    Visit Number  1    Number of Visits  11    Date for OT Re-Evaluation  07/21/17    Authorization Type  Medicare    Authorization Time Period  60 days 06/06/17-08/04/16    Authorization - Visit Number  1    Authorization - Number of Visits  10    OT Start Time  [phone]    OT Stop Time  1015    OT Time Calculation (min)  38 min    Activity Tolerance  Patient tolerated treatment well    Behavior During Therapy  [facility] for tasks assessed/performed       Past Medical History:  Diagnosis Date  . Ductal carcinoma in situ (DCIS) of left breast  [facility] in [place]--- per pt no recurrence   dx s/p  left breast lumpectomy and radiation  . History of acute pyelonephritis 03/06/2017   w/ sepsis due to ureteral stone obstruction  . History of radiation therapy 2016   left breast   . Left ureteral stone   . Leta [facility] disease [facility])   . Nephrolithiasis    bilateral non-obstructive per CT 03-06-2017  . OSA on CPAP    setting 8  . Sensation of pressure in bladder area   . Tachycardia    taking beta blocker  . Uses walker   . Wears glasses     Past Surgical History:  Procedure Laterality Date  . BREAST LUMPECTOMY Left 2016   [place] in [place]   DCIS-lumpectomy  . CESAREAN SECTION  1990  . CYSTOSCOPY WITH RIGHT STENT PLACEMENT Right 03/06/2017   Performed by Franchot Gallo, MD at [facility] ORS  . CYSTOSCOPY/URETEROSCOPY/HOLMIUM LASER/STONE EXTRACTION/STENT REPLACEMENT Right 04/03/2017   Performed by Franchot Gallo, MD at [facility]  . LAMINECTOMY   2003   L4-5  . PORTA CATH INSERTION  08/2016   power port for ALS IV medication    There were no vitals filed for this visit.     [facility] OT Assessment - 06/06/17 0943      Assessment   Diagnosis  ALS    Referring Provider  Dr. Creig Hines , Dr. Rolena Infante   Onset Date  -- diagnosed in 2016, symptoms x 4-5 yrs    Prior Therapy  PT      Precautions   Precautions  Fall    Required Braces or Orthoses  Other Brace/Splint    Other Brace/Splint  AFO right side, KAFO did not work well  Pt does not wear KAFO!      Restrictions   Weight Bearing Restrictions  No      Balance Screen   Has the patient fallen in the past 6 months  Yes    How many times?  2    Has the patient had a decrease in activity level because of a fear of falling?   Yes    Is the patient reluctant to leave their home because of a fear of falling?   No      Home  Environment   Family/patient expects to be discharged to:  Private  residence    Living Arrangements  Spouse/significant other    Available Help at Discharge  Family    Type of [place]    Lives With  Spouse      Prior Function   Level of Independence  Independent    Leisure  photography, enjoys travel       ADL   Eating/Feeding  Needs assist with cutting food    Grooming  -- difficulty using tweezers    Upper Body Bathing  Modified independent has suction cup grab bards    Lower Body Bathing  Modified independent has shower seat    Upper Body Dressing  Needs assist for fasteners modified independent    Lower Body Dressing  Needs assist for fasteners    Toilet Transfer  Modified independent sometimes using cane at home, other times she does not     Tub/Shower Transfer  Modified independent has sution cup grab bars, and seat    ADL comments  Difficulty fastening pants button, opening bottles/ containers, limited strength for reaching into overhead cabinets      IADL   Shopping  Takes care of all shopping needs independently Pt does  shopping with cart    Light Housekeeping  Performs light daily tasks such as dishwashing, bed making has someone who clenas 1x month    Meal Prep  Able to complete simple warm meal prep    Medication Management  Is responsible for taking medication in correct dosages at correct time      Mobility   Mobility Status  Independent      Written Expression   Dominant Hand  Right    Handwriting  100% legible      Vision Assessment   Vision Assessment  Vision not tested      Activity Tolerance   Activity Tolerance  -- Able to stand for cooking for several hours       Cognition   Overall Cognitive Status  Within Functional Limits for tasks assessed      Coordination   9 Hole Peg Test  Right;Left    Right 9 Hole Peg Test  25.66 secs    Left 9 Hole Peg Test  28.50 secs      ROM / Strength   AROM / PROM / Strength  AROM      AROM   Overall AROM   Within functional limits for tasks performed with exception of left 5th digit PIP joint, due to old fx    Overall AROM Comments  Pt demonstrates full A/ROM, however pt has limited overhead strength for overhead reach      Strength   Right Shoulder Flexion  3+/5    Left Shoulder Flexion  3/5    Right Elbow Flexion  4/5    Right Elbow Extension  4/5    Left Elbow Flexion  3+/5    Left Elbow Extension  3+/5      Hand Function   Right Hand Grip (lbs)  50    Right Hand Lateral Pinch  6 lbs tip pinch 4 lbs    Right Hand 3 Point Pinch  6 lbs    Left Hand Grip (lbs)  52    Left Hand Lateral Pinch  6 lbs tip pinch 4 lbs    Left 3 point pinch  8 lbs  OT Long Term Goals - 2017/06/18 1113      OT LONG TERM GOAL #1   Title  I with HEP- due 07/21/16    Time  5    Period  Weeks    Status  New    Target Date  07/21/17      OT LONG TERM GOAL #2   Title  Pt will verbalize understanding of activity modification, DME and AE to increase safety and independence with ADLS/IADLS.    Time  5    Period  Weeks     Status  New      OT LONG TERM GOAL #3   Title  Pt will verbalize understanding of energy conservation techniques    Time  5    Period  Weeks    Status  New            Plan - Jun 18, 2017 1116    Clinical Impression Statement  The patient is a 61 year old female presenting with 4-5 year h/o progressive weakness dx with ALS in 04/2015. Pt moved with her husband from [place] last November. she plans to move back in the next several months. Pt presents with the following deficitis: decreased strength, decreased balance, decreased functional mobility, decreased activity tolerance which impedes performance of ADLS/IADLS. Pt can benefiT from skilled occupational therapy to maximize pt's safety and independence with daily activities.    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  She is currently unable to work due to her deficits, Pt is moving back to [place] in the next several months. PMH: laminectomy L4-5 2003, tachycardia-uses beta blocker, pylelonephritis, radiation therapy left breast, lumpectomy left breast     Occupational performance deficits (Please refer to evaluation for details):  ADL's;IADL's;Work;Play;Leisure;Social Participation    Rehab Potential  Good    Current Impairments/barriers affecting progress:  length of time since inital diagnosis    OT Frequency  2x / week    OT Duration  -- 5 weeks plus eval    OT Treatment/Interventions  Self-care/ADL training;Moist Heat;Fluidtherapy;DME and/or AE instruction;Splinting;Patient/family education;Balance training;Therapeutic exercises;Gait Training;Aquatic Therapy;Ultrasound;Therapeutic exercise;Therapeutic activities;Cognitive remediation/compensation;Passive range of motion;Functional Mobility Training;Neuromuscular education;Cryotherapy;Parrafin;Energy conservation;Manual Therapy    Plan  initate HEP, consider light putty, ball ex vs. low  range theraband    Clinical Decision Making  Limited treatment options, no task modification necessary    Consulted and Agree with Plan of Care  Patient       Patient will benefit from skilled therapeutic intervention in order to improve the following deficits and impairments:  Abnormal gait, Decreased knowledge of use of DME, Decreased mobility, Decreased activity tolerance, Decreased endurance, Decreased strength, Impaired UE functional use, Impaired perceived functional ability, Difficulty walking, Decreased safety awareness, Decreased knowledge of precautions, Decreased balance  Visit Diagnosis: Muscle weakness (generalized)  Other abnormalities of gait and mobility  Other symptoms and signs involving the nervous system  G-Codes - 2017/06/18 1135    Functional Assessment Tool Used (Outpatient only)  : difficulty pulling up pants in back, difficulty opening containers, and fastening pants button    Functional Limitation  Self care    Self Care Current Status (X9024)  At least 20 percent but less than 40 percent impaired, limited or restricted    Self Care Goal Status (O9735)  At least 1 percent but less than 20 percent impaired, limited or restricted       Problem List Patient Active  Problem List   Diagnosis Date Noted  . UTI (urinary tract infection) 03/21/2017  . Pyonephrosis 03/06/2017  . Sepsis ([facility]) 03/06/2017  . ALS (amyotrophic lateral sclerosis) ([place]) 03/06/2017    RINE,KATHRYN 06/06/2017, 11:36 AM Theone Murdoch, OTR/L [phone] Phone: [phone] 11:37 AM 06/06/17 [facility] [address] [place], [place], [address] Phone: [phone]   Fax:  [phone]  Name: Laurie Wise MRN: [id number] Date of Birth: 1955-11-14

## 2017-06-07 NOTE — Therapy (Signed)
[facility] [address] [place], [place], [address] Phone: [phone]   Fax:  [phone]  Physical Therapy Treatment  Patient Details  Name: Laurie Wise MRN: [id number] Date of Birth: May 01, 1956 Referring Provider: Rolena Infante, MD   Encounter Date: 06/06/2017  PT End of Session - 06/07/17 0926    Visit Number  8    Number of Visits  17    Date for PT Re-Evaluation  07/04/17    Authorization Type  private insurance    PT Start Time  [phone]    PT Stop Time  0933    PT Time Calculation (min)  44 min       Past Medical History:  Diagnosis Date  . Ductal carcinoma in situ (DCIS) of left breast  [facility] in [place]--- per pt no recurrence   dx s/p  left breast lumpectomy and radiation  . History of acute pyelonephritis 03/06/2017   w/ sepsis due to ureteral stone obstruction  . History of radiation therapy 2016   left breast   . Left ureteral stone   . Leta [facility] disease [facility])   . Nephrolithiasis    bilateral non-obstructive per CT 03-06-2017  . OSA on CPAP    setting 8  . Sensation of pressure in bladder area   . Tachycardia    taking beta blocker  . Uses walker   . Wears glasses     Past Surgical History:  Procedure Laterality Date  . BREAST LUMPECTOMY Left 2016   [place] in [place]   DCIS-lumpectomy  . CESAREAN SECTION  1990  . CYSTOSCOPY WITH STENT PLACEMENT Right 03/06/2017   Procedure: CYSTOSCOPY WITH RIGHT STENT PLACEMENT;  Surgeon: Franchot Gallo, MD;  Location: [facility] ORS;  Service: Urology;  Laterality: Right;  . CYSTOSCOPY/URETEROSCOPY/HOLMIUM LASER/STENT PLACEMENT Right 04/03/2017   Procedure: CYSTOSCOPY/URETEROSCOPY/HOLMIUM LASER/STONE EXTRACTION/STENT REPLACEMENT;  Surgeon: Franchot Gallo, MD;  Location: [facility];  Service: Urology;  Laterality: Right;  . LAMINECTOMY  2003   L4-5  . PORTA CATH INSERTION  08/2016   power port  for ALS IV medication    There were no vitals filed for this visit.  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    Patient Stated Goals  "Overall strength, and in that process learn how to get up off the floor."  Also recently obtained a separate knee brace after trying KAFO *wants to look at options.    Currently in Pain?  No/denies                      [facility] Adult PT Treatment/Exercise - 06/07/17 0001      Bed Mobility   Bed Mobility  Sit to Supine;Sitting - Scoot to Edge of Bed;Sit to Sidelying Left    Sitting - Scoot to Edge of Bed  6: Modified independent (Device/Increase time)    Sit to Supine  6: Modified independent (Device/Increase time)    Sit to Sidelying Left  6: Modified independent (Device/Increase time)      Ambulation/Gait   Ambulation/Gait  Yes    Ambulation/Gait Assistance  6: Modified independent (Device/Increase time)  Ambulation Distance (Feet)  125 Feet    Assistive device  Straight cane x 2    Ambulation Surface  Level;Indoor    Gait velocity  13.27 secs = 2.47 = 2.5 ft/sec with 2 canes      Self-Care   Self-Care  Other Self-Care Comments    Other Self-Care Comments   Continued to discuss orthotic options to control Rt knee hyperextension - pt states she wants a brace similar to an ACL brace which would be easey to don-- pt states she is much more concerned aobut the hyperextension than she is about the knee buckling; plan is to trial use of Swedish knee cage when one is obtained for clinic use      NeuroRe-ed:  Work on core stabilization in tall kneeling - green physioball in front for UE support prn:  Pt performed horizontal And vertical head  turns x 5 reps each with minimal UE support:  Trunk rotation with bil. UE's flexed at 90 degrees - 3 reps To Right and left sides Moving RLE/LLE  leg laterally/back in x 5 reps with UE support with CGA for safety Moving each leg anteriorly/posteriorly for trunk control with weight shifting - 5 reps each leg Pt performed squats in tall kneeling position - 10 reps with cues to squeeze gluteals in return to tall kneeling position       PT Short Term Goals - 06/07/17 0919      PT SHORT TERM GOAL #1   Title  The patient will return demo HEP for flexibility and maintenance of strength.    Status  Achieved      PT SHORT TERM GOAL #2   Title  The patient will be further assessed on bed mobility and LTG to follow (currently notes crawling on hands/knees and "flopping" over).    Status  On-going      PT SHORT TERM GOAL #3   Title  The patient will improve gait speed from 2.05 ft/sec to 2.5 ft/sec utilizing more supportive assitive device and R AFO/knee brace to improve stability.     Baseline  13.27 secs with use of 2 canes and AFO on RLE (no knee brace used on RLE) = 2.47 = 2.5 ft/sec  (06-06-17)    Status  Partially Met      PT SHORT TERM GOAL #4   Title  The patient will move floor<>stand with UE support and min A    Status  Achieved        PT Long Term Goals - 05/05/17 0901      PT LONG TERM GOAL #1   Title  The patient will verbalize understanding of community welllness for post d/c.    Time  8    Period  Weeks    Target Date  07/04/17      PT LONG TERM GOAL #2   Title  The patient will have R LE bracing needs met to improve energy efficiency and safety with gait activities.    Time  8    Period  Weeks    Target Date  07/04/17      PT LONG TERM GOAL #3   Title  The patient will ambulate x 800 ft with RW (if willing to consider for longer community outings) mod indep to improve community access.    Time  8    Period  Weeks    Target Date  07/04/17      PT LONG TERM GOAL  #4  Title  The patient will move floor<>stand with UE support modified indep.    Time  8    Period  Weeks    Target Date  07/04/17      PT LONG TERM GOAL #5   Title  Bed mobility goal to follow.    Time  8    Period  Weeks    Target Date  07/04/17            Plan - 06/07/17 0926    Clinical Impression Statement  Pt partially met STG #3 with gait velocity of 2.5 ft/sec achieved but pt is not wearing any knee orthosis on RLE, only wearing AFO at this time;  STG#3 to be assessed with bed mobility - do not know how to modify pt's performance of bed mobility as she states her bed at home is high and she has concerns for safety with use of step stool to assist with getting in/out of bed - will continue to discuss with primary PT    Rehab Potential  Good    PT Frequency  2x / week    PT Duration  8 weeks    PT Treatment/Interventions  ADLs/Self Care Home Management;Functional mobility training;Gait training;Therapeutic activities;Therapeutic exercise;Balance training;Neuromuscular re-education;Patient/family education;Orthotic Fit/Training;Manual techniques    PT Next Visit Plan   Check remaining STG's #3;   core training for rolling and bed mobility; continue working to [place].     PT Home Exercise Plan  hip abdct in hooklying, knee flexion prone and seated, LAQ with red theraband    Consulted and Agree with Plan of Care  Patient       Patient will benefit from skilled therapeutic intervention in order to improve the following deficits and impairments:  Abnormal gait, Decreased balance, Decreased activity tolerance, Decreased strength, Difficulty walking, Impaired flexibility  Visit Diagnosis: Other abnormalities of gait and mobility  Muscle weakness (generalized)     Problem List Patient Active Problem List   Diagnosis Date Noted  . UTI (urinary tract infection) 03/21/2017  . Pyonephrosis 03/06/2017  . Sepsis ([place]) 03/06/2017  . ALS (amyotrophic lateral sclerosis) ([place])  03/06/2017    Tayshaun Kroh, [place], PT 06/07/2017, 9:32 AM  [facility] [address] [place], [place], [address] Phone: [phone]   Fax:  [phone]  Name: Laurie Wise MRN: [id number] Date of Birth: [birth year]

## 2017-06-12 ENCOUNTER — Ambulatory Visit: Payer: BLUE CROSS/BLUE SHIELD | Admitting: Rehabilitative and Restorative Service Providers"

## 2017-06-12 ENCOUNTER — Encounter: Payer: Self-pay | Admitting: Rehabilitative and Restorative Service Providers"

## 2017-06-12 ENCOUNTER — Ambulatory Visit: Payer: BLUE CROSS/BLUE SHIELD | Admitting: Occupational Therapy

## 2017-06-12 DIAGNOSIS — M6281 Muscle weakness (generalized): Secondary | ICD-10-CM | POA: Diagnosis not present

## 2017-06-12 DIAGNOSIS — R2689 Other abnormalities of gait and mobility: Secondary | ICD-10-CM

## 2017-06-12 DIAGNOSIS — R29818 Other symptoms and signs involving the nervous system: Secondary | ICD-10-CM

## 2017-06-12 NOTE — Therapy (Signed)
[facility] [address] [place], [place], [address] Phone: [phone]   Fax:  [phone]  Physical Therapy Treatment  Patient Details  Name: Laurie Wise MRN: [id number] Date of Birth: Dec 01, 1955 Referring Provider: Rolena Infante, MD   Encounter Date: 06/12/2017  PT End of Session - 06/12/17 1517    Visit Number  9    Number of Visits  17    Date for PT Re-Evaluation  07/04/17    Authorization Type  private insurance    PT Start Time  [phone]    PT Stop Time  0930    PT Time Calculation (min)  41 min    Equipment Utilized During Treatment  Gait belt    Activity Tolerance  Patient tolerated treatment well    Behavior During Therapy  [facility] for tasks assessed/performed       Past Medical History:  Diagnosis Date  . Ductal carcinoma in situ (DCIS) of left breast  [facility] in [place]--- per pt no recurrence   dx s/p  left breast lumpectomy and radiation  . History of acute pyelonephritis 03/06/2017   w/ sepsis due to ureteral stone obstruction  . History of radiation therapy 2016   left breast   . Left ureteral stone   . 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 Nephrolithiasis    bilateral non-obstructive per CT 03-06-2017  . OSA on CPAP    setting 8  . Sensation of pressure in bladder area   . Tachycardia    taking beta blocker  . Uses walker   . Wears glasses     Past Surgical History:  Procedure Laterality Date  . BREAST LUMPECTOMY Left 2016   [place]   DCIS-lumpectomy  . CESAREAN SECTION  1990  . CYSTOSCOPY WITH STENT PLACEMENT Right 03/06/2017   Procedure: CYSTOSCOPY WITH RIGHT STENT PLACEMENT;  Surgeon: Franchot Gallo, MD;  Location: [facility];  Service: Urology;  Laterality: Right;  . CYSTOSCOPY/URETEROSCOPY/HOLMIUM LASER/STENT PLACEMENT Right 04/03/2017   Procedure: CYSTOSCOPY/URETEROSCOPY/HOLMIUM LASER/STONE EXTRACTION/STENT REPLACEMENT;  Surgeon: Franchot Gallo, MD;  [facility];  Service: Urology;  Laterality: Right;  . LAMINECTOMY  2003   L4-5  . PORTA CATH INSERTION  08/2016   power port for ALS IV medication    There were no vitals filed for this visit.  Subjective Assessment - 06/12/17 0851    Subjective  Patient notes that she is not sure when she will be moving back to [place].  She wants brace for knee to be addressed now.  She slid off edge of bed to floor today--was able to crawl to a chair for support to move floor<>sitting.    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    Patient Stated Goals  "Overall strength, and in that process learn how to get up off the floor."  Also recently obtained a separate knee brace after trying KAFO *wants to look at options.    Currently in Pain?  No/denies         [facility] PT Assessment - 06/12/17 0856      Bed Mobility   Bed Mobility  Supine to Sit;Sit to Supine;Rolling Right;Rolling Left    Rolling Right  7: Independent    Rolling Left  7: Independent    Supine to Sit  7: Independent    Sit to Supine  7: Independent  [facility] Adult PT Treatment/Exercise - 06/12/17 0856      Ambulation/Gait   Ambulation/Gait  Yes    Ambulation/Gait Assistance  6: Modified independent (Device/Increase time)    Ambulation Distance (Feet)  250 Feet    Assistive device  Straight cane bilateral    Ambulation Surface  Level;Indoor      Exercises   Exercises  Other Exercises    Other Exercises   Supine rolling: performing supine<>sidelying to each direction x 10 reps.  Sidelying hip abduction 10 reps right and left sides, Prone on elbows reaching alternating right and left sides x 5 reps each, Supine knee to chest rolling a ball for core stabilization, seated hamstring strengthening x 10 reps with red theraband knee flexion.               PT Education - 06/12/17 0931    Education provided  Yes     Education Details  HEP: belly breathing    Person(s) Educated  Patient    Methods  Explanation;Demonstration;Handout    Comprehension  Verbalized understanding;Returned demonstration       PT Short Term Goals - 06/12/17 1518      PT SHORT TERM GOAL #1   Title  The patient will return demo HEP for flexibility and maintenance of strength.    Status  Achieved      PT SHORT TERM GOAL #2   Title  The patient will be further assessed on bed mobility and LTG to follow (currently notes crawling on hands/knees and "flopping" over).    Baseline  *patient is able to demo bed mobility with mod indep in clinic for sit<>supine.  Her bed at home is elevated and she is unable to get into/out of by sitting on edge.     Status  Deferred      PT SHORT TERM GOAL #3   Title  The patient will improve gait speed from 2.05 ft/sec to 2.5 ft/sec utilizing more supportive assitive device and R AFO/knee brace to improve stability.     Baseline  13.27 secs with use of 2 canes and AFO on RLE (no knee brace used on RLE) = 2.47 = 2.5 ft/sec  (06-06-17)    Status  Partially Met      PT SHORT TERM GOAL #4   Title  The patient will move floor<>stand with UE support and min A    Status  Achieved        PT Long Term Goals - 06/12/17 1519      PT LONG TERM GOAL #1   Title  The patient will verbalize understanding of community welllness for post d/c.    Time  8    Period  Weeks      PT LONG TERM GOAL #2   Title  The patient will have R LE bracing needs met to improve energy efficiency and safety with gait activities.    Time  8    Period  Weeks      PT LONG TERM GOAL #3   Title  The patient will ambulate x 800 ft with RW (if willing to consider for longer community outings) mod indep to improve community access.    Time  8    Period  Weeks      PT LONG TERM GOAL #4   Title  The patient will move floor<>stand with UE support modified indep.    Time  8    Period  Weeks  PT LONG TERM GOAL #5   Title   Bed mobility goal to follow.    Baseline  see STGs.    Time  8    Period  Weeks    Status  Deferred            Plan - 06/12/17 1523    Clinical Impression Statement  PT d/c goal for bed mobility as patient is able to perform in clinic.  We discussed some strategies for performing scooting in bed today.  PT to continue working to [employer].  Plan to trial swedish knee cage with R AFO to reduce recurvatum.     PT Treatment/Interventions  ADLs/Self Care Home Management;Functional mobility training;Gait training;Therapeutic activities;Therapeutic exercise;Balance training;Neuromuscular re-education;Patient/family education;Orthotic Fit/Training;Manual techniques    PT Next Visit Plan  Work on prone to quadriped positioning, sitting to quadriped for floor transfers.      Consulted and Agree with Plan of Care  Patient       Patient will benefit from skilled therapeutic intervention in order to improve the following deficits and impairments:  Abnormal gait, Decreased balance, Decreased activity tolerance, Decreased strength, Difficulty walking, Impaired flexibility  Visit Diagnosis: Muscle weakness (generalized)  Other abnormalities of gait and mobility  Other symptoms and signs involving the nervous system     Problem List Patient Active Problem List   Diagnosis Date Noted  . UTI (urinary tract infection) 03/21/2017  . Pyonephrosis 03/06/2017  . Sepsis ([place]) 03/06/2017  . ALS (amyotrophic lateral sclerosis) ([place]) 03/06/2017    Khaliya Golinski, PT 06/12/2017, 3:28 PM  [facility] [address] [place], [place], [address] Phone: [phone]   Fax:  [phone]  Name: Laurie Wise MRN: [id number] Date of Birth: May 11, 1956

## 2017-06-12 NOTE — Therapy (Signed)
[facility] [address] [place], [place], [address] Phone: [phone]   Fax:  [phone]  Occupational Therapy Treatment  Patient Details  Name: Shaakira Borrero MRN: [id number] Date of Birth: July 14, 1956 Referring Provider: Dr. Creig Hines   Encounter Date: 06/12/2017  OT End of Session - 06/12/17 0849    Visit Number  2    Number of Visits  11    Date for OT Re-Evaluation  07/21/17    Authorization Type  Medicare    Authorization Time Period  60 days 06/06/17-08/04/16    Authorization - Visit Number  2    Authorization - Number of Visits  10    OT Start Time  0803    OT Stop Time  1245    OT Time Calculation (min)  44 min    Activity Tolerance  Patient tolerated treatment well       Past Medical History:  Diagnosis Date  . Ductal carcinoma in situ (DCIS) of left breast  [facility] in [place]--- per pt no recurrence   dx s/p  left breast lumpectomy and radiation  . History of acute pyelonephritis 03/06/2017   w/ sepsis due to ureteral stone obstruction  . History of radiation therapy 2016   left breast   . Left ureteral stone   . 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 Nephrolithiasis    bilateral non-obstructive per CT 03-06-2017  . OSA on CPAP    setting 8  . Sensation of pressure in bladder area   . Tachycardia    taking beta blocker  . Uses walker   . Wears glasses     Past Surgical History:  Procedure Laterality Date  . BREAST LUMPECTOMY Left 2016   [facility] in [place]   DCIS-lumpectomy  . CESAREAN SECTION  1990  . CYSTOSCOPY WITH STENT PLACEMENT Right 03/06/2017   Procedure: CYSTOSCOPY WITH RIGHT STENT PLACEMENT;  Surgeon: Franchot Gallo, MD;  [facility];  Service: Urology;  Laterality: Right;  . CYSTOSCOPY/URETEROSCOPY/HOLMIUM LASER/STENT PLACEMENT Right 04/03/2017   Procedure: CYSTOSCOPY/URETEROSCOPY/HOLMIUM LASER/STONE EXTRACTION/STENT REPLACEMENT;  Surgeon:  Franchot Gallo, MD;  Location: [facility];  Service: Urology;  Laterality: Right;  . LAMINECTOMY  2003   L4-5  . PORTA CATH INSERTION  08/2016   power port for ALS IV medication    There were no vitals filed for this visit.  Subjective Assessment - 06/12/17 0808    Currently in Pain?  No/denies                   OT Treatments/Exercises (OP) - 06/12/17 0001      Exercises   Exercises  -- See pt instructions for details. Pt performed each ex as indicated             OT Education - 06/12/17 0844    Education provided  Yes    Education Details  ROM and strengthening HEP     Person(s) Educated  Patient    Methods  Explanation;Demonstration;Handout    Comprehension  Verbalized understanding;Returned demonstration          OT Long Term Goals - 06/12/17 0850      OT LONG TERM GOAL #1   Title  I with HEP- due 07/21/16    Time  5    Period  Weeks    Status  On-going      OT LONG TERM GOAL #2   Title  Pt will verbalize  understanding of activity modification, DME and AE to increase safety and independence with ADLS/IADLS.    Time  5    Period  Weeks    Status  New      OT LONG TERM GOAL #3   Title  Pt will verbalize understanding of energy conservation techniques    Time  5    Period  Weeks    Status  New            Plan - 06/12/17 0850    Clinical Impression Statement  Pt tolerating HEP well with decreased repetitions to avoid over fatigue    Rehab Potential  Good    OT Frequency  2x / week    OT Duration  -- 5 weeks plus eval    OT Treatment/Interventions  Self-care/ADL training;Moist Heat;Fluidtherapy;DME and/or AE instruction;Splinting;Patient/family education;Balance training;Therapeutic exercises;Gait Training;Aquatic Therapy;Ultrasound;Therapeutic exercise;Therapeutic activities;Cognitive remediation/compensation;Passive range of motion;Functional Mobility Training;Neuromuscular education;Cryotherapy;Parrafin;Energy  conservation;Manual Therapy    Plan  review HEP prn, id A/E needs (possible reacher, button hook) and/or task modifications/adaptations   Consulted and Agree with Plan of Care  Patient       Patient will benefit from skilled therapeutic intervention in order to improve the following deficits and impairments:  Abnormal gait, Decreased knowledge of use of DME, Decreased mobility, Decreased activity tolerance, Decreased endurance, Decreased strength, Impaired UE functional use, Impaired perceived functional ability, Difficulty walking, Decreased safety awareness, Decreased knowledge of precautions, Decreased balance  Visit Diagnosis: Muscle weakness (generalized)  Other symptoms and signs involving the nervous system    Problem List Patient Active Problem List   Diagnosis Date Noted  . UTI (urinary tract infection) 03/21/2017  . Pyonephrosis 03/06/2017  . Sepsis ([place]) 03/06/2017  . ALS (amyotrophic lateral sclerosis) ([place]) 03/06/2017    Carey Bullocks, OTR/L 06/12/2017, 8:52 AM  [facility] [address] [place], [place], [address] Phone: [phone]   Fax:  [phone]  Name: Genavieve Mangiapane MRN: [id number] Date of Birth: [birth year]

## 2017-06-12 NOTE — Patient Instructions (Signed)
Belly    Place fingertips of both hands gently on abdomen. Take a slow breath in through nose and feel stomach expand, moving hands apart. Take __10_ slow breaths.  Copyright  VHI. All rights reserved.

## 2017-06-12 NOTE — Patient Instructions (Signed)
SHOULDER: Flexion - Sitting    Hold cane with both hands. Raise arms up. Keep elbows straight. Hold _2__ seconds. NO weight _5__ reps per set, _3__ sets per day  Abduction (Eccentric) - Active (Cane)    SITTING; Lift cane out to side with RT arm, then Lt arm. Avoid hiking shoulder. Keep palm relaxed. Slowly lower affected arm for 3 seconds. _5__ reps per set, _3  sets per day  Chest / Shoulder Stretch: Yardstick Along Spine    Hold cane horizontally against buttocks, hands shoulder width apart. Bend elbows, and raise stick along spine. Hold 1 count. Slowly return to starting position. Repeat __5-10__ times. Do 3 sets per day  ROM: Extension - Wand (Standing)    Stand holding wand behind back, palms forward. Raise arms as far as possible. Repeat _5___ times per set.  Do __3__ sessions per day.  Scapular Retraction: Bilateral    Facing anchor, pull arms back, bringing shoulder blades together. Repeat _5-10___ times per set. Do __1__ sessions per day.   Strengthening: Resisted Flexion   Hold tubing with __both___ arm(s) at side. Pull forward and up. Move shoulder through pain-free range of motion. Repeat __5_ times per set.  Do _1_ sessions per day     PUTTY EXERCISES:   1. Lateral Pinch Strengthening (Resistive Putty)    Squeeze between thumb and side of index finger in turn. Thumb on top Repeat _5-10___ times. Do __2__ sessions per day.  2. Roll putty into thick tube on table, pinch between thumb, index, and long fingers x 5-10 reps, 2 sessions per day

## 2017-06-15 ENCOUNTER — Ambulatory Visit: Payer: BLUE CROSS/BLUE SHIELD | Admitting: Rehabilitative and Restorative Service Providers"

## 2017-06-15 ENCOUNTER — Encounter: Payer: Self-pay | Admitting: Rehabilitative and Restorative Service Providers"

## 2017-06-15 ENCOUNTER — Ambulatory Visit: Payer: BLUE CROSS/BLUE SHIELD | Admitting: Occupational Therapy

## 2017-06-15 DIAGNOSIS — M6281 Muscle weakness (generalized): Secondary | ICD-10-CM | POA: Diagnosis not present

## 2017-06-15 DIAGNOSIS — R2689 Other abnormalities of gait and mobility: Secondary | ICD-10-CM

## 2017-06-15 DIAGNOSIS — R29818 Other symptoms and signs involving the nervous system: Secondary | ICD-10-CM

## 2017-06-15 NOTE — Therapy (Signed)
[facility] [address] [place], [place], [address] Phone: [phone]   Fax:  [phone]  Physical Therapy Treatment  Patient Details  Name: Laurie Wise MRN: [id number] Date of Birth: 06/30/1956 Referring Provider: Rolena Infante, MD   Encounter Date: 06/15/2017  PT End of Session - 06/15/17 0853    Visit Number  10    Number of Visits  17    Date for PT Re-Evaluation  07/04/17    Authorization Type  private insurance    PT Start Time  [phone]    PT Stop Time  0930    PT Time Calculation (min)  41 min    Equipment Utilized During Treatment  Gait belt    Activity Tolerance  Patient tolerated treatment well    Behavior During Therapy  [facility] for tasks assessed/performed       Past Medical History:  Diagnosis Date  . Ductal carcinoma in situ (DCIS) of left breast  [facility] in [place]--- per pt no recurrence   dx s/p  left breast lumpectomy and radiation  . History of acute pyelonephritis 03/06/2017   w/ sepsis due to ureteral stone obstruction  . History of radiation therapy 2016   left breast   . Left ureteral stone   . 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 Nephrolithiasis    bilateral non-obstructive per CT 03-06-2017  . OSA on CPAP    setting 8  . Sensation of pressure in bladder area   . Tachycardia    taking beta blocker  . Uses walker   . Wears glasses     Past Surgical History:  Procedure Laterality Date  . BREAST LUMPECTOMY Left 2016   [place] in [place]   DCIS-lumpectomy  . CESAREAN SECTION  1990  . CYSTOSCOPY WITH STENT PLACEMENT Right 03/06/2017   Procedure: CYSTOSCOPY WITH RIGHT STENT PLACEMENT;  Surgeon: Franchot Gallo, MD;  [facility];  Service: Urology;  Laterality: Right;  . CYSTOSCOPY/URETEROSCOPY/HOLMIUM LASER/STENT PLACEMENT Right 04/03/2017   Procedure: CYSTOSCOPY/URETEROSCOPY/HOLMIUM LASER/STONE EXTRACTION/STENT REPLACEMENT;  Surgeon: Franchot Gallo, MD;  Location: [facility];  Service: Urology;  Laterality: Right;  . LAMINECTOMY  2003   L4-5  . PORTA CATH INSERTION  08/2016   power port for ALS IV medication    There were no vitals filed for this visit.  Subjective Assessment - 06/15/17 0850    Subjective  The patient reports low back is sore today (points to sacral area).  She notes this has been going on for years.     Patient Stated Goals  "Overall strength, and in that process learn how to get up off the floor."  Also recently obtained a separate knee brace after trying KAFO *wants to look at options.    Currently in Pain?  Yes    Pain Score  4  notices it during each step    Pain Location  Buttocks    Pain Orientation  Right;Left    Pain Descriptors / Indicators  Sharp    Pain Type  Chronic pain    Pain Onset  More than a month ago    Pain Frequency  Intermittent    Aggravating Factors   worse with walking    Pain Relieving Factors  sitting, ibuprofen                      [facility] Adult PT Treatment/Exercise - 06/15/17 1104  Self-Care   Self-Care  Other Self-Care Comments    Other Self-Care Comments   PT and patient discussed:  1) Pathology of ALS diagnosis as patient inquires about nerves "pulling out"  of muscles.  PT showed information from [place] online.  2) Inquired about bracing options.  PT again showed swedish knee cage and noted we would not have one available in the clinic until 2nd week of December 3) PT goals with patient noting that she wants to work on floor transfers soon. 4) Discussed goals of PT regarding safety in community.      Exercises   Exercises  Other Exercises    Other Exercises   Supine breathing exercises using hands for tactile cues and feedback.  Discussed/recommended patient perform 10 breaths 3 times/day.  Prone to quadriped for work towards floor transfers performing quadriped to 1/2 side sit x 10 reps to each side.                 PT  Short Term Goals - 06/12/17 1518      PT SHORT TERM GOAL #1   Title  The patient will return demo HEP for flexibility and maintenance of strength.    Status  Achieved      PT SHORT TERM GOAL #2   Title  The patient will be further assessed on bed mobility and LTG to follow (currently notes crawling on hands/knees and "flopping" over).    Baseline  *patient is able to demo bed mobility with mod indep in clinic for sit<>supine.  Her bed at home is elevated and she is unable to get into/out of by sitting on edge.     Status  Deferred      PT SHORT TERM GOAL #3   Title  The patient will improve gait speed from 2.05 ft/sec to 2.5 ft/sec utilizing more supportive assitive device and R AFO/knee brace to improve stability.     Baseline  13.27 secs with use of 2 canes and AFO on RLE (no knee brace used on RLE) = 2.47 = 2.5 ft/sec  (06-06-17)    Status  Partially Met      PT SHORT TERM GOAL #4   Title  The patient will move floor<>stand with UE support and min A    Status  Achieved        PT Long Term Goals - 06/12/17 1519      PT LONG TERM GOAL #1   Title  The patient will verbalize understanding of community welllness for post d/c.    Time  8    Period  Weeks      PT LONG TERM GOAL #2   Title  The patient will have R LE bracing needs met to improve energy efficiency and safety with gait activities.    Time  8    Period  Weeks      PT LONG TERM GOAL #3   Title  The patient will ambulate x 800 ft with RW (if willing to consider for longer community outings) mod indep to improve community access.    Time  8    Period  Weeks      PT LONG TERM GOAL #4   Title  The patient will move floor<>stand with UE support modified indep.    Time  8    Period  Weeks      PT LONG TERM GOAL #5   Title  Bed mobility goal to follow.    Baseline  see STGs.    Time  8    Period  Weeks    Status  Deferred            Plan - 06/15/17 1202    Clinical Impression Statement  The patient and PT  discussing long term goals and intent of therapy to maintain strength and improve safety/reduce fall risk.  PT and paitnet also discussed may need to hold to access swedish knee cage until 2nd week of December as this is next item to trial.  Plan to f/u on Monday next week after ALS clinic visit tomorrow.  Will decide length of stay and frequency to adjust as indicated.     PT Treatment/Interventions  ADLs/Self Care Home Management;Functional mobility training;Gait training;Therapeutic activities;Therapeutic exercise;Balance training;Neuromuscular re-education;Patient/family education;Orthotic Fit/Training;Manual techniques    PT Next Visit Plan  ? consider hold until availability of swedish knee cage, f/u questions regarding ALS clinic, floor transfers    Consulted and Agree with Plan of Care  Patient       Patient will benefit from skilled therapeutic intervention in order to improve the following deficits and impairments:  Abnormal gait, Decreased balance, Decreased activity tolerance, Decreased strength, Difficulty walking, Impaired flexibility  Visit Diagnosis: Muscle weakness (generalized)  Other symptoms and signs involving the nervous system  Other abnormalities of gait and mobility     Problem List Patient Active Problem List   Diagnosis Date Noted  . UTI (urinary tract infection) 03/21/2017  . Pyonephrosis 03/06/2017  . Sepsis ([place]) 03/06/2017  . ALS (amyotrophic lateral sclerosis) ([place]) 03/06/2017    Gaddiel Cullens, PT 06/15/2017, 12:05 PM  [facility] [address] [place], [place], [address] Phone: [phone]   Fax:  [phone]  Name: Annaliyah Willig MRN: [id number] Date of Birth: 01-26-1956

## 2017-06-16 DIAGNOSIS — G1221 Amyotrophic lateral sclerosis: Secondary | ICD-10-CM | POA: Diagnosis not present

## 2017-06-16 DIAGNOSIS — M6281 Muscle weakness (generalized): Secondary | ICD-10-CM | POA: Diagnosis not present

## 2017-06-16 DIAGNOSIS — R296 Repeated falls: Secondary | ICD-10-CM | POA: Diagnosis not present

## 2017-06-16 DIAGNOSIS — Z5181 Encounter for therapeutic drug level monitoring: Secondary | ICD-10-CM | POA: Diagnosis not present

## 2017-06-16 DIAGNOSIS — R269 Unspecified abnormalities of gait and mobility: Secondary | ICD-10-CM | POA: Diagnosis not present

## 2017-06-16 NOTE — Therapy (Signed)
[facility] [address] [place], [place], [address] Phone: [phone]   Fax:  [phone]  Occupational Therapy Treatment  Patient Details  Name: Laurie Wise MRN: [id number] Date of Birth: 05-16-1956 Referring Provider: Dr. Creig Hines   Encounter Date: 06/15/2017  OT End of Session - 06/15/17 0807    Visit Number  3    Number of Visits  11    Date for OT Re-Evaluation  07/21/17    Authorization Type  Medicare    Authorization Time Period  60 days 06/06/17-08/04/16    Authorization - Visit Number  3    Authorization - Number of Visits  10    OT Start Time  0805    OT Stop Time  0845    OT Time Calculation (min)  40 min       Past Medical History:  Diagnosis Date  . Ductal carcinoma in situ (DCIS) of left breast  [facility] in [place]--- per pt no recurrence   dx s/p  left breast lumpectomy and radiation  . History of acute pyelonephritis 03/06/2017   w/ sepsis due to ureteral stone obstruction  . History of radiation therapy 2016   left breast   . Left ureteral stone   . 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 Nephrolithiasis    bilateral non-obstructive per CT 03-06-2017  . OSA on CPAP    setting 8  . Sensation of pressure in bladder area   . Tachycardia    taking beta blocker  . Uses walker   . Wears glasses     Past Surgical History:  Procedure Laterality Date  . BREAST LUMPECTOMY Left 2016   [facility] in [place]   DCIS-lumpectomy  . CESAREAN SECTION  1990  . CYSTOSCOPY WITH STENT PLACEMENT Right 03/06/2017   Procedure: CYSTOSCOPY WITH RIGHT STENT PLACEMENT;  Surgeon: Franchot Gallo, MD;  Location: [facility];  Service: Urology;  Laterality: Right;  . CYSTOSCOPY/URETEROSCOPY/HOLMIUM LASER/STENT PLACEMENT Right 04/03/2017   Procedure: CYSTOSCOPY/URETEROSCOPY/HOLMIUM LASER/STONE EXTRACTION/STENT REPLACEMENT;  Surgeon: Franchot Gallo, MD;  Location: [facility];  Service: Urology;  Laterality: Right;  . LAMINECTOMY  2003   L4-5  . PORTA CATH INSERTION  08/2016   power port for ALS IV medication    There were no vitals filed for this visit.  Subjective Assessment - 06/16/17 1427    Pertinent History  ALS    Patient Stated Goals  to maintain arm strength    Currently in Pain?  No/denies                           OT Education - 06/16/17 1430    Education provided  Yes    Education Details  reviewed HEP issued last visit, use of buttonhook, rocker knives    Person(s) Educated  Patient    Methods  Explanation;Handout;Verbal cues;Demonstration    Comprehension  Need further instruction          OT Long Term Goals - 06/12/17 0850      OT LONG TERM GOAL #1   Title  I with HEP- due 07/21/16    Time  5    Period  Weeks    Status  On-going      OT LONG TERM GOAL #2   Title  Pt will verbalize understanding of activity modification, DME and AE to increase safety and independence with ADLS/IADLS.    Time  5    Period  Weeks    Status  New      OT LONG TERM GOAL #3   Title  Pt will verbalize understanding of energy conservation techniques    Time  5    Period  Weeks    Status  New            Plan - 06/15/17 4536    Clinical Impression Statement  Pt returned demonstration of HEP    Rehab Potential  Good    Current Impairments/barriers affecting progress:  length of time since inital diagnosis    OT Frequency  2x / week    OT Duration  -- 5 weeks plus eval    OT Treatment/Interventions  Self-care/ADL training;Moist Heat;Fluidtherapy;DME and/or AE instruction;Splinting;Balance training;Therapeutic activities;Therapeutic exercise;Passive range of motion;Functional Mobility Training;Neuromuscular education;Electrical Stimulation;Paraffin;Energy conservation;Patient/family education    Plan  continue to address AE needs, gentle strengthening    Consulted and Agree with Plan of Care  Patient        Patient will benefit from skilled therapeutic intervention in order to improve the following deficits and impairments:  Abnormal gait, Decreased knowledge of use of DME, Decreased mobility, Decreased activity tolerance, Decreased endurance, Decreased strength, Impaired UE functional use, Impaired perceived functional ability, Difficulty walking, Decreased safety awareness, Decreased knowledge of precautions, Decreased balance  Visit Diagnosis: Muscle weakness (generalized)  Other symptoms and signs involving the nervous system    Problem List Patient Active Problem List   Diagnosis Date Noted  . UTI (urinary tract infection) 03/21/2017  . Pyonephrosis 03/06/2017  . Sepsis ([place]) 03/06/2017  . ALS (amyotrophic lateral sclerosis) ([place]) 03/06/2017    RINE,KATHRYN 06/16/2017, 2:31 PM Theone Murdoch, OTR/L [phone] Phone: [phone] 2:31 PM 06/16/17 [facility] [address] [place], [place], [address] Phone: [phone]   Fax:  [phone]  Name: Laurie Wise MRN: [id number] Date of Birth: Jun 23, 1956

## 2017-06-19 ENCOUNTER — Ambulatory Visit: Payer: BLUE CROSS/BLUE SHIELD | Admitting: Occupational Therapy

## 2017-06-19 ENCOUNTER — Ambulatory Visit: Payer: BLUE CROSS/BLUE SHIELD | Admitting: Physical Therapy

## 2017-06-20 ENCOUNTER — Encounter: Payer: Medicare Other | Admitting: Occupational Therapy

## 2017-06-22 ENCOUNTER — Encounter: Payer: Self-pay | Admitting: Rehabilitative and Restorative Service Providers"

## 2017-06-22 ENCOUNTER — Ambulatory Visit: Payer: BLUE CROSS/BLUE SHIELD | Attending: Neurology | Admitting: Occupational Therapy

## 2017-06-22 ENCOUNTER — Ambulatory Visit: Payer: BLUE CROSS/BLUE SHIELD | Admitting: Rehabilitative and Restorative Service Providers"

## 2017-06-22 ENCOUNTER — Encounter: Payer: Self-pay | Admitting: Occupational Therapy

## 2017-06-22 DIAGNOSIS — M6281 Muscle weakness (generalized): Secondary | ICD-10-CM | POA: Diagnosis present

## 2017-06-22 DIAGNOSIS — R29818 Other symptoms and signs involving the nervous system: Secondary | ICD-10-CM | POA: Insufficient documentation

## 2017-06-22 DIAGNOSIS — R2689 Other abnormalities of gait and mobility: Secondary | ICD-10-CM | POA: Insufficient documentation

## 2017-06-22 NOTE — Therapy (Signed)
**Note De-Identified Laurie Obfuscation** [facility] [address] [place], [place], [address] Phone: [phone]   Fax:  [phone]  Physical Therapy Treatment  Patient Details  Name: Laurie Wise MRN: [id number] Date of Birth: 09-14-1955 Referring Provider: Rolena Infante, MD   Encounter Date: 06/22/2017  PT End of Session - 06/22/17 2059    Visit Number  11    Number of Visits  17    Date for PT Re-Evaluation  07/04/17    Authorization Type  private insurance    PT Start Time  1025    PT Stop Time  1105    PT Time Calculation (min)  40 min    Equipment Utilized During Treatment  Gait belt    Activity Tolerance  Patient tolerated treatment well    Behavior During Therapy  [facility] for tasks assessed/performed       Past Medical History:  Diagnosis Date  . Ductal carcinoma in situ (DCIS) of left breast  [facility] in [place]--- per pt no recurrence   dx s/p  left breast lumpectomy and radiation  . History of acute pyelonephritis 03/06/2017   w/ sepsis due to ureteral stone obstruction  . History of radiation therapy 2016   left breast   . Left ureteral stone   . 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 Nephrolithiasis    bilateral non-obstructive per CT 03-06-2017  . OSA on CPAP    setting 8  . Sensation of pressure in bladder area   . Tachycardia    taking beta blocker  . Uses walker   . Wears glasses     Past Surgical History:  Procedure Laterality Date  . BREAST LUMPECTOMY Left 2016   [facility] in [place]   DCIS-lumpectomy  . CESAREAN SECTION  1990  . CYSTOSCOPY WITH STENT PLACEMENT Right 03/06/2017   Procedure: CYSTOSCOPY WITH RIGHT STENT PLACEMENT;  Surgeon: Franchot Gallo, MD;  [facility];  Service: Urology;  Laterality: Right;  . CYSTOSCOPY/URETEROSCOPY/HOLMIUM LASER/STENT PLACEMENT Right 04/03/2017   Procedure: CYSTOSCOPY/URETEROSCOPY/HOLMIUM LASER/STONE EXTRACTION/STENT REPLACEMENT;  Surgeon: Franchot Gallo, MD;  [facility];  Service: Urology;  Laterality: Right;  . LAMINECTOMY  2003   L4-5  . PORTA CATH INSERTION  08/2016   power port for ALS IV medication    There were no vitals filed for this visit.  Subjective Assessment - 06/22/17 1028    Subjective  The patient reports she was exhausted Monday from a busy weekend looking for a home in [place].  She had a respiratory test on Friday at [facility].  She reported that they tested her inspiration and expiration. Per patient, she reports the illness she had 15 months ago is potentially what compromised her breathing.                       [facility] Adult PT Treatment/Exercise - 06/22/17 1035      Self-Care   Self-Care  Other Self-Care Comments    Other Self-Care Comments   Patient is planning to relocate to [place] 2nd week of February.  PT and patient discussed holding PT for 1-2 visits while we get swedish knee cage in clinic to trial.    *Discussed transfers in the community if she falls.  Recommended she crawl or scoot to use a curb or bumper of a car for assist.  She also may want to have lightweight plastic stool in her trunk as this significantly improved her  ability to help with the transfer from floor<>mat.        Therapeutic Activites    Therapeutic Activities  Other Therapeutic Activities    Other Therapeutic Activities  Floor transfers: 1) Patient demonstrated mod indep moving from mat>floor using UE support.  To return from floor>mat, she can perform mod indep using a 4" stool to put L knee on, then pushes down through mat with bilat UEs.  She can do this mod indep.  Without having a stool, she moves into tall kneeling, then crawls up onto mat using UEs to pull herself.  She is able to perform this mod indep.  We trialed moving to 1/2 kneeling position and patinet is unable to stand up.              PT Education - 06/22/17 2059    Education provided  Yes    Education Details   reviewed verbally prior HEP and re-printed    Person(s) Educated  Patient    Methods  Explanation    Comprehension  Verbalized understanding       PT Short Term Goals - 06/12/17 1518      PT SHORT TERM GOAL #1   Title  The patient will return demo HEP for flexibility and maintenance of strength.    Status  Achieved      PT SHORT TERM GOAL #2   Title  The patient will be further assessed on bed mobility and LTG to follow (currently notes crawling on hands/knees and "flopping" over).    Baseline  *patient is able to demo bed mobility with mod indep in clinic for sit<>supine.  Her bed at home is elevated and she is unable to get into/out of by sitting on edge.     Status  Deferred      PT SHORT TERM GOAL #3   Title  The patient will improve gait speed from 2.05 ft/sec to 2.5 ft/sec utilizing more supportive assitive device and R AFO/knee brace to improve stability.     Baseline  13.27 secs with use of 2 canes and AFO on RLE (no knee brace used on RLE) = 2.47 = 2.5 ft/sec  (06-06-17)    Status  Partially Met      PT SHORT TERM GOAL #4   Title  The patient will move floor<>stand with UE support and min A    Status  Achieved        PT Long Term Goals - 06/22/17 2106      PT LONG TERM GOAL #1   Title  The patient will verbalize understanding of community welllness for post d/c.    Time  8    Period  Weeks      PT LONG TERM GOAL #2   Title  The patient will have R LE bracing needs met to improve energy efficiency and safety with gait activities.    Time  8    Period  Weeks      PT LONG TERM GOAL #3   Title  The patient will ambulate x 800 ft with RW (if willing to consider for longer community outings) mod indep to improve community access.    Time  8    Period  Weeks      PT LONG TERM GOAL #4   Title  The patient will move floor<>stand with UE support modified indep.    Baseline  Met on 06/22/17.    Time  8    Period  Weeks    Status  Achieved      PT LONG TERM GOAL #5    Title  Bed mobility goal to follow.    Baseline  see STGs.    Time  8    Period  Weeks    Status  Deferred            Plan - 06/22/17 2106    Clinical Impression Statement  The patient met LTG for floor transfers.  She needs UE support to safely move floor<>mat.  PT recommended we hold visits until our clinic obtains swedish knee cage for trial.  Patient agrees.     PT Treatment/Interventions  ADLs/Self Care Home Management;Functional mobility training;Gait training;Therapeutic activities;Therapeutic exercise;Balance training;Neuromuscular re-education;Patient/family education;Orthotic Fit/Training;Manual techniques    PT Next Visit Plan  hold until swedish knee cage available; work towards [employer] and Agree with Plan of Care  Patient       Patient will benefit from skilled therapeutic intervention in order to improve the following deficits and impairments:  Abnormal gait, Decreased balance, Decreased activity tolerance, Decreased strength, Difficulty walking, Impaired flexibility  Visit Diagnosis: Muscle weakness (generalized)  Other symptoms and signs involving the nervous system  Other abnormalities of gait and mobility     Problem List Patient Active Problem List   Diagnosis Date Noted  . UTI (urinary tract infection) 03/21/2017  . Pyonephrosis 03/06/2017  . Sepsis ([place]) 03/06/2017  . ALS (amyotrophic lateral sclerosis) ([place]) 03/06/2017    Katelinn Justice, PT 06/22/2017, 9:08 PM  [facility] [address] [place], [place], [address] Phone: [phone]   Fax:  [phone]  Name: Laurie Wise MRN: [id number] Date of Birth: 1956-03-20

## 2017-06-22 NOTE — Patient Instructions (Signed)
Patient Instructions  Signed  Encounter Date:  05/24/2017          Signed           Straight Leg Raise    Tighten stomach and slowly raise locked right leg __6__ inches from floor.  TOES POINT TO THE CEILING (NO ROLLING OUT). Repeat _10-15___ times per set. Do __1__ sets per session. Do __1-2__ sessions per day.  http://orth.exer.us/1103   Copyright  VHI. All rights reserved.   KNEE: Flexion - Prone    Bend knee. Raise heel toward buttocks. Do not raise hips. _10__ reps per set, _1__ sets per day, _5__ days per week  No BAND to begin   Knee Flexion: Resisted (Sitting)    Sit with band under left foot and looped around ankle of supported leg. Pull unsupported leg back. Repeat __10__ times per set. Do _1___ sets per session. Do __1__ sessions per day.  (RED BAND)  http://orth.exer.us/695  Copyright  VHI. All rights reserved. Knee Extension: Resisted (Sitting)    With band looped around right ankle and under other foot, straighten leg with ankle loop. Keep other leg bent to increase resistance. Repeat __10__ times per set. Do __1__ sets per session. Do __1__ sessions per day.  BEGIN WITH NO BAND AND WORK ON FULL RANGE OF MOTION.  http://orth.exer.us/691  Copyright  VHI. All rights reserved.

## 2017-06-22 NOTE — Therapy (Signed)
[facility] [address] [place], [place], [address] Phone: [phone]   Fax:  [phone]  Occupational Therapy Treatment  Patient Details  Name: Laurie Wise MRN: [id number] Date of Birth: 1956-03-01 Referring Provider: Dr. Creig Hines   Encounter Date: 06/22/2017  OT End of Session - 06/22/17 1111    Visit Number  4    Number of Visits  11    Date for OT Re-Evaluation  07/21/17    Authorization Type  Medicare    Authorization Time Period  60 days 06/06/17-08/04/16    Authorization - Visit Number  4    Authorization - Number of Visits  10    OT Start Time  1110    OT Stop Time  1150    OT Time Calculation (min)  40 min    Activity Tolerance  Patient tolerated treatment well    Behavior During Therapy  [facility] for tasks assessed/performed       Past Medical History:  Diagnosis Date  . Ductal carcinoma in situ (DCIS) of left breast  [facility] in [place]--- per pt no recurrence   dx s/p  left breast lumpectomy and radiation  . History of acute pyelonephritis 03/06/2017   w/ sepsis due to ureteral stone obstruction  . History of radiation therapy 2016   left breast   . Left ureteral stone   . 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 Nephrolithiasis    bilateral non-obstructive per CT 03-06-2017  . OSA on CPAP    setting 8  . Sensation of pressure in bladder area   . Tachycardia    taking beta blocker  . Uses walker   . Wears glasses     Past Surgical History:  Procedure Laterality Date  . BREAST LUMPECTOMY Left 2016   [place] in [place]   DCIS-lumpectomy  . CESAREAN SECTION  1990  . CYSTOSCOPY WITH STENT PLACEMENT Right 03/06/2017   Procedure: CYSTOSCOPY WITH RIGHT STENT PLACEMENT;  Surgeon: Franchot Gallo, MD;  [facility];  Service: Urology;  Laterality: Right;  . CYSTOSCOPY/URETEROSCOPY/HOLMIUM LASER/STENT PLACEMENT Right 04/03/2017   Procedure:  CYSTOSCOPY/URETEROSCOPY/HOLMIUM LASER/STONE EXTRACTION/STENT REPLACEMENT;  Surgeon: Franchot Gallo, MD;  Location: [facility];  Service: Urology;  Laterality: Right;  . LAMINECTOMY  2003   L4-5  . PORTA CATH INSERTION  08/2016   power port for ALS IV medication    There were no vitals filed for this visit.  Subjective Assessment - 06/22/17 1111    Subjective   I was too exhausted Monday because we spent the weekend in [place] looking for houses    Pertinent History  ALS    Patient Stated Goals  to maintain arm strength    Currently in Pain?  No/denies        Pt educated in strategies/AE for ADLs/IADLs (see below).  Pt verbalized understanding of all education provided.  Reviewed use of built-up handles/bigger diameter objects for pens, cooking utensils, eyeliner pencil, toothbrush, eating utensils, etc (pt issued 3 red foam grips and 2 tan grips).    Recommended sitting for ADL tasks that require more effort/coordination:  Putting on make-up, doing hair, etc.    Recommended organizing frequently used/heavy items between shoulder and waist height.  Recommended lightweight objects, particularly as they replace things (pots/pans, bowls, vacuum, dishes, etc.)  Discussed use of non-slip rug/mat inside/outside shower, grab bars for shower/toilet, use of long-handled sponge, use of liquid soap vs. Bar, use of terrycloth  robe to decr time drying off.  Educated pt in strategies for donning shirt (posture, gathering up material), use of button pull for zippers that get stuck.  Educated/shown pictures of options to help pt open bottles/jars including AE and shelf liner.    Discussed use of bed rail, yoga step (due to high height of bed) or low profile box springs.                         OT Long Term Goals - 06/12/17 0850      OT LONG TERM GOAL #1   Title  I with HEP- due 07/21/16    Time  5    Period  Weeks    Status  On-going      OT LONG  TERM GOAL #2   Title  Pt will verbalize understanding of activity modification, DME and AE to increase safety and independence with ADLS/IADLS.    Time  5    Period  Weeks    Status  New      OT LONG TERM GOAL #3   Title  Pt will verbalize understanding of energy conservation techniques    Time  5    Period  Weeks    Status  New            Plan - 06/22/17 1206    Clinical Impression Statement  Pt verbalized understanding of AE/strategies for ADLs after education provided.    Rehab Potential  Good    Current Impairments/barriers affecting progress:  length of time since inital diagnosis    OT Frequency  2x / week    OT Duration  -- 5 weeks plus eval    OT Treatment/Interventions  Self-care/ADL training;Moist Heat;Fluidtherapy;DME and/or AE instruction;Splinting;Balance training;Therapeutic activities;Therapeutic exercise;Passive range of motion;Functional Mobility Training;Neuromuscular education;Electrical Stimulation;Paraffin;Energy conservation;Patient/family education    Plan  continue to address AE needs, gentle strengthening    Consulted and Agree with Plan of Care  Patient       Patient will benefit from skilled therapeutic intervention in order to improve the following deficits and impairments:  Abnormal gait, Decreased knowledge of use of DME, Decreased mobility, Decreased activity tolerance, Decreased endurance, Decreased strength, Impaired UE functional use, Impaired perceived functional ability, Difficulty walking, Decreased safety awareness, Decreased knowledge of precautions, Decreased balance  Visit Diagnosis: Muscle weakness (generalized)  Other symptoms and signs involving the nervous system  Other abnormalities of gait and mobility    Problem List Patient Active Problem List   Diagnosis Date Noted  . UTI (urinary tract infection) 03/21/2017  . Pyonephrosis 03/06/2017  . Sepsis ([place]) 03/06/2017  . ALS (amyotrophic lateral sclerosis) ([place]) 03/06/2017     [facility] 06/22/2017, 12:10 PM  [facility] [address] [place], [place], [address] Phone: [phone]   Fax:  [phone]  Name: Laurie Wise MRN: [id number] Date of Birth: April 03, 1956   Vianne Bulls, OTR/L [facility] [address] [place], [place]  [address] [phone] phone [phone] 06/22/17 12:10 PM

## 2017-06-26 ENCOUNTER — Ambulatory Visit: Payer: Medicare Other | Admitting: Rehabilitative and Restorative Service Providers"

## 2017-06-26 ENCOUNTER — Encounter: Payer: Medicare Other | Admitting: Occupational Therapy

## 2017-06-27 ENCOUNTER — Encounter: Payer: Medicare Other | Admitting: Occupational Therapy

## 2017-06-29 ENCOUNTER — Encounter: Payer: Self-pay | Admitting: Rehabilitative and Restorative Service Providers"

## 2017-06-29 ENCOUNTER — Ambulatory Visit: Payer: Medicare Other | Admitting: Physical Therapy

## 2017-06-29 ENCOUNTER — Ambulatory Visit: Payer: BLUE CROSS/BLUE SHIELD | Admitting: Occupational Therapy

## 2017-06-29 ENCOUNTER — Ambulatory Visit: Payer: BLUE CROSS/BLUE SHIELD | Admitting: Rehabilitative and Restorative Service Providers"

## 2017-06-29 DIAGNOSIS — R29818 Other symptoms and signs involving the nervous system: Secondary | ICD-10-CM

## 2017-06-29 DIAGNOSIS — M6281 Muscle weakness (generalized): Secondary | ICD-10-CM

## 2017-06-29 DIAGNOSIS — R2689 Other abnormalities of gait and mobility: Secondary | ICD-10-CM

## 2017-06-29 NOTE — Therapy (Signed)
[facility] [address] [place], [place], [address] Phone: [phone]   Fax:  [phone]  Physical Therapy Treatment  Patient Details  Name: Laurie Wise MRN: [id number] Date of Birth: Jun 10, 1956 Referring Provider: Rolena Infante, MD   Encounter Date: 06/29/2017  PT End of Session - 06/29/17 1500    Visit Number  12    Number of Visits  17    Date for PT Re-Evaluation  07/04/17    Authorization Type  private insurance    PT Start Time  1106    PT Stop Time  1135    PT Time Calculation (min)  29 min    Equipment Utilized During Treatment  Gait belt    Activity Tolerance  Patient tolerated treatment well    Behavior During Therapy  [facility] for tasks assessed/performed       Past Medical History:  Diagnosis Date  . Ductal carcinoma in situ (DCIS) of left breast  [facility] in [place]--- per pt no recurrence   dx s/p  left breast lumpectomy and radiation  . History of acute pyelonephritis 03/06/2017   w/ sepsis due to ureteral stone obstruction  . History of radiation therapy 2016   left breast   . Left ureteral stone   . 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 Nephrolithiasis    bilateral non-obstructive per CT 03-06-2017  . OSA on CPAP    setting 8  . Sensation of pressure in bladder area   . Tachycardia    taking beta blocker  . Uses walker   . Wears glasses     Past Surgical History:  Procedure Laterality Date  . BREAST LUMPECTOMY Left 2016   [facility] in [place]   DCIS-lumpectomy  . CESAREAN SECTION  1990  . CYSTOSCOPY WITH STENT PLACEMENT Right 03/06/2017   Procedure: CYSTOSCOPY WITH RIGHT STENT PLACEMENT;  Surgeon: Franchot Gallo, MD;  Location: [facility];  Service: Urology;  Laterality: Right;  . CYSTOSCOPY/URETEROSCOPY/HOLMIUM LASER/STENT PLACEMENT Right 04/03/2017   Procedure: CYSTOSCOPY/URETEROSCOPY/HOLMIUM LASER/STONE EXTRACTION/STENT REPLACEMENT;  Surgeon: Franchot Gallo, MD;  Location: [facility];  Service: Urology;  Laterality: Right;  . LAMINECTOMY  2003   L4-5  . PORTA CATH INSERTION  08/2016   power port for ALS IV medication    There were no vitals filed for this visit.  Subjective Assessment - 06/29/17 1112    Subjective  The patient reports that she had one fall (straight back while trying to pull her hair back).  She notes sore on her R ischial tuberosity since then.  She was able to get up using a chair to press down on.     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    Patient Stated Goals  "Overall strength, and in that process learn how to get up off the floor."  Also recently obtained a separate knee brace after trying KAFO *wants to look at options.    Currently in Pain?  No/denies                      [facility] Adult PT Treatment/Exercise - 06/29/17 1501      Ambulation/Gait   Ambulation/Gait  Yes    Ambulation/Gait Assistance  6: Modified independent (Device/Increase time)    Ambulation Distance (Feet)  230 Feet    Assistive device  Straight cane bilateral    Ambulation Surface  Level;Indoor  Gait Comments  ORTHOTICS AND PROSTHETICS CHECKOUT:  patient tried R LE swedish knee cage for purposes of controlling genu recurvatum and instability in the sagittal plane.  The patient has improved control of recurvatum, and it did not create anterior buckling.  She reports this is a better option than KAFO and knee brace, which she reports are both too cumbersome to donn and she does not wear them.       Self-Care   Self-Care  Other Self-Care Comments    Other Self-Care Comments   Discussed process for obtaining brace and goals of PT.  Also discussed use of rolling RW with patient showing me pictures of recently ordered walker from [employer].               PT Education - 06/29/17 1458    Education provided  Yes    Education Details  discussed process for  ordering swedish knee cage    Person(s) Educated  Patient    Methods  Explanation;Demonstration    Comprehension  Verbalized understanding       PT Short Term Goals - 06/12/17 1518      PT SHORT TERM GOAL #1   Title  The patient will return demo HEP for flexibility and maintenance of strength.    Status  Achieved      PT SHORT TERM GOAL #2   Title  The patient will be further assessed on bed mobility and LTG to follow (currently notes crawling on hands/knees and "flopping" over).    Baseline  *patient is able to demo bed mobility with mod indep in clinic for sit<>supine.  Her bed at home is elevated and she is unable to get into/out of by sitting on edge.     Status  Deferred      PT SHORT TERM GOAL #3   Title  The patient will improve gait speed from 2.05 ft/sec to 2.5 ft/sec utilizing more supportive assitive device and R AFO/knee brace to improve stability.     Baseline  13.27 secs with use of 2 canes and AFO on RLE (no knee brace used on RLE) = 2.47 = 2.5 ft/sec  (06-06-17)    Status  Partially Met      PT SHORT TERM GOAL #4   Title  The patient will move floor<>stand with UE support and min A    Status  Achieved        PT Long Term Goals - 06/29/17 1514      PT LONG TERM GOAL #1   Title  The patient will verbalize understanding of community welllness for post d/c.    Time  8    Period  Weeks      PT LONG TERM GOAL #2   Title  The patient will have R LE bracing needs met to improve energy efficiency and safety with gait activities.    Baseline  Plan to order a R swedish knee cage.     Time  8    Period  Weeks    Status  Achieved      PT LONG TERM GOAL #3   Title  The patient will ambulate x 800 ft with RW (if willing to consider for longer community outings) mod indep to improve community access.    Time  8    Period  Weeks      PT LONG TERM GOAL #4   Title  The patient will move floor<>stand with UE support modified indep.  Baseline  Met on 06/22/17.    Time   8    Period  Weeks    Status  Achieved      PT LONG TERM GOAL #5   Title  Bed mobility goal to follow.    Baseline  see STGs.    Time  8    Period  Weeks    Status  Deferred            Plan - 06/29/17 1514    Clinical Impression Statement  The patient met LTG for knee brace assessment and did well with swedish knee cage today.  Patient requested PT e-mail PT at [facility] to obtain order from MD at [facility].  I requested order be sent to [facility] in [place].  PT to check HEP, and work towards other [place].     PT Treatment/Interventions  ADLs/Self Care Home Management;Functional mobility training;Gait training;Therapeutic activities;Therapeutic exercise;Balance training;Neuromuscular re-education;Patient/family education;Orthotic Fit/Training;Manual techniques    PT Next Visit Plan  Work to LTGs, review HEP, LE strengthening, obtain swedish knee cage, d/c planning    Consulted and Agree with Plan of Care  Patient       Patient will benefit from skilled therapeutic intervention in order to improve the following deficits and impairments:  Abnormal gait, Decreased balance, Decreased activity tolerance, Decreased strength, Difficulty walking, Impaired flexibility  Visit Diagnosis: Muscle weakness (generalized)  Other symptoms and signs involving the nervous system  Other abnormalities of gait and mobility     Problem List Patient Active Problem List   Diagnosis Date Noted  . UTI (urinary tract infection) 03/21/2017  . Pyonephrosis 03/06/2017  . Sepsis ([place]) 03/06/2017  . ALS (amyotrophic lateral sclerosis) ([facility]) 03/06/2017    Berenize Gatlin, PT 06/29/2017, 3:16 PM  [facility] [address] [place], [place], [address] Phone: [phone]   Fax:  [phone]  Name: Laurie Wise MRN: [id number] Date of Birth: Jul 20, 1955

## 2017-06-30 ENCOUNTER — Emergency Department (HOSPITAL_COMMUNITY)
Admission: EM | Admit: 2017-06-30 | Discharge: 2017-06-30 | Disposition: A | Discharge status: Home / Self Care | Payer: BLUE CROSS/BLUE SHIELD | Attending: Emergency Medicine | Admitting: Emergency Medicine

## 2017-06-30 ENCOUNTER — Encounter (HOSPITAL_COMMUNITY): Payer: Self-pay

## 2017-06-30 ENCOUNTER — Emergency Department (HOSPITAL_COMMUNITY): Payer: BLUE CROSS/BLUE SHIELD

## 2017-06-30 DIAGNOSIS — S0003XA Contusion of scalp, initial encounter: Secondary | ICD-10-CM | POA: Insufficient documentation

## 2017-06-30 DIAGNOSIS — W01198A Fall on same level from slipping, tripping and stumbling with subsequent striking against other object, initial encounter: Secondary | ICD-10-CM | POA: Insufficient documentation

## 2017-06-30 DIAGNOSIS — Y999 Unspecified external cause status: Secondary | ICD-10-CM | POA: Insufficient documentation

## 2017-06-30 DIAGNOSIS — Z87891 Personal history of nicotine dependence: Secondary | ICD-10-CM | POA: Diagnosis not present

## 2017-06-30 DIAGNOSIS — G1221 Amyotrophic lateral sclerosis: Secondary | ICD-10-CM | POA: Insufficient documentation

## 2017-06-30 DIAGNOSIS — D0512 Intraductal carcinoma in situ of left breast: Secondary | ICD-10-CM | POA: Diagnosis not present

## 2017-06-30 DIAGNOSIS — Z79899 Other long term (current) drug therapy: Secondary | ICD-10-CM | POA: Insufficient documentation

## 2017-06-30 DIAGNOSIS — S0990XA Unspecified injury of head, initial encounter: Secondary | ICD-10-CM | POA: Diagnosis not present

## 2017-06-30 DIAGNOSIS — S199XXA Unspecified injury of neck, initial encounter: Secondary | ICD-10-CM | POA: Diagnosis not present

## 2017-06-30 DIAGNOSIS — Y92512 Supermarket, store or market as the place of occurrence of the external cause: Secondary | ICD-10-CM | POA: Diagnosis not present

## 2017-06-30 DIAGNOSIS — Y9301 Activity, walking, marching and hiking: Secondary | ICD-10-CM | POA: Insufficient documentation

## 2017-06-30 NOTE — ED Triage Notes (Signed)
EDP at bedside  

## 2017-06-30 NOTE — ED Triage Notes (Signed)
Transported via w/c to CT

## 2017-06-30 NOTE — ED Provider Notes (Signed)
Medical screening examination/treatment/procedure(s) were conducted as a shared visit with non-physician practitioner(s) and myself.  I personally evaluated the patient during the encounter.  61 year old female with a history of ALS who presents to the emergency department today secondary to mechanical fall after hitting the back of her head  She has no complaints besides a headache at this time.  States she has no new numbness or weakness outside of her chronic issues with ALS.  She has a large contusion to the posterior occipital area but no other evidence of trauma.  No tenderness elsewhere.  Plan for CT scan of head and neck.   Merrily Pew, MD 07/02/17 [phone]

## 2017-06-30 NOTE — ED Triage Notes (Signed)
Pt states she was walking in [employer] and fell straight back hitting her head on the concrete, no LOC, pt has a hematoma to the back of her head and no other complaints

## 2017-06-30 NOTE — ED Provider Notes (Signed)
[facility] DEPT Provider Note   CSN: [id number] Arrival date & time: 06/30/17  2025     History   Chief Complaint Chief Complaint  Patient presents with  . Fall  . Head Injury    HPI Laurie Wise is a 61 y.o. female with history of Leverne Humbles disease who presents following fall.  Patient reports she was walking in [place] and trying to get through a tight spot when she lost her footing and fell straight back and hit her head on concrete.  Patient denies feeling dizzy or lightheaded prior to the fall.  She did not lose consciousness.  She has a hematoma to the back of her head and a generalized headache, but denies any dizziness or lightheaded at this time.  She did not take any medications prior to arrival.  She denies any neck pain out of the ordinary for her.  Patient uses a cane and has a brace on her right leg to help with ambulation, but does have gait disturbance at baseline.  HPI  Past Medical History:  Diagnosis Date  . Ductal carcinoma in situ (DCIS) of left breast  [facility] in [place]--- per pt no recurrence   dx s/p  left breast lumpectomy and radiation  . History of acute pyelonephritis 03/06/2017   w/ sepsis due to ureteral stone obstruction  . History of radiation therapy 2016   left breast   . Left ureteral stone   . 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 Nephrolithiasis    bilateral non-obstructive per CT 03-06-2017  . OSA on CPAP    setting 8  . Sensation of pressure in bladder area   . Tachycardia    taking beta blocker  . Uses walker   . Wears glasses     Patient Active Problem List   Diagnosis Date Noted  . UTI (urinary tract infection) 03/21/2017  . Pyonephrosis 03/06/2017  . Sepsis ([place]) 03/06/2017  . ALS (amyotrophic lateral sclerosis) ([place]) 03/06/2017    Past Surgical History:  Procedure Laterality Date  . BREAST LUMPECTOMY Left 2016   [facility] in [place]   DCIS-lumpectomy  .  CESAREAN SECTION  1990  . CYSTOSCOPY WITH STENT PLACEMENT Right 03/06/2017   Procedure: CYSTOSCOPY WITH RIGHT STENT PLACEMENT;  Surgeon: Franchot Gallo, MD;  Location: [facility];  Service: Urology;  Laterality: Right;  . CYSTOSCOPY/URETEROSCOPY/HOLMIUM LASER/STENT PLACEMENT Right 04/03/2017   Procedure: CYSTOSCOPY/URETEROSCOPY/HOLMIUM LASER/STONE EXTRACTION/STENT REPLACEMENT;  Surgeon: Franchot Gallo, MD;  Location[facility];  Service: Urology;  Laterality: Right;  . LAMINECTOMY  2003   L4-5  . PORTA CATH INSERTION  08/2016   power port for ALS IV medication    OB History    No data available       Home Medications    Prior to Admission medications   Medication Sig Start Date End Date Taking? Authorizing Provider  acetaminophen (TYLENOL) 500 MG tablet Take 1,000 mg by mouth every 6 (six) hours as needed for mild pain, moderate pain, fever or headache.    [provider]  B Complex Vitamins (B COMPLEX 50) TABS Take 2 tablets by mouth daily.    [provider]  Bioflavonoid Products (ESTER-C) TABS Take 2 tablets by mouth daily.    [provider]  cephALEXin (KEFLEX) 500 MG capsule Take 1 capsule (500 mg total) by mouth 2 (two) times daily. Patient not taking: Reported on 05/05/2017 04/03/17   Franchot Gallo, MD  Cholecalciferol (  VITAMIN D PO) Take 1 tablet by mouth daily.    [provider]  Cyanocobalamin (VITAMIN B-12 SL) Place 1 tablet under the tongue daily.    [provider]  Edaravone (RADICAVA) 30 MG/100ML SOLN Inject 60 mg into the vein See admin instructions. 10 days on and 18 days off [facility] = Mingo Amber, RN [phone] [place], [place]  Currently Sunday will be off days starting Sunday 03-19-2017    [provider]  Fish Oil-Cholecalciferol (OMEGA-3 + D PO) Take 5 mLs by mouth daily.    [provider]  IRON PO Take 4 tablets by mouth daily. OTC    [provider]    metoprolol tartrate (LOPRESSOR) 25 MG tablet Take 0.5 tablets (12.5 mg total) by mouth 2 (two) times daily. Take if having worsening symptoms of tremors, palpitations. 03/08/17   Mariel Aloe, MD  Multiple Vitamin (MULTIVITAMIN WITH MINERALS) TABS tablet Take 1 tablet by mouth 2 (two) times daily.     [provider]  oxybutynin (DITROPAN) 5 MG tablet Take 1 tablet (5 mg total) by mouth every 8 (eight) hours as needed for bladder spasms. 03/08/17   Mariel Aloe, MD  Probiotic CAPS Take 1 capsule by mouth daily.    [provider]  RILUZOLE PO Take 1 tablet by mouth 2 (two) times daily.     [provider]  tamoxifen (NOLVADEX) 10 MG tablet Take 10 mg by mouth 2 (two) times daily.     [provider]  TURMERIC PO Take 2 tablets by mouth daily.    [provider]  Venlafaxine HCl 150 MG TB24 Take 150 mg by mouth every morning.     [provider]  vitamin E 400 UNIT capsule Take 1,200 Units by mouth daily.    [provider]    Family History Family History  Problem Relation Age of Onset  . Parkinson's disease Mother   . Renal cancer Father   . CAD Father   . Diabetes Other   . Stroke Neg Hx   . Clotting disorder Neg Hx     Social History Social History   Tobacco Use  . Smoking status: Former Smoker    Years: 10.00    Types: Cigarettes    Last attempt to quit: 03/18/1971    Years since quitting: 46.3  . Smokeless tobacco: Never Used  Substance Use Topics  . Alcohol use: No  . Drug use: No     Allergies   Adhesive [tape]   Review of Systems Review of Systems  Constitutional: Negative for chills and fever.  HENT: Negative for facial swelling and sore throat.   Respiratory: Negative for shortness of breath.   Cardiovascular: Negative for chest pain.  Gastrointestinal: Negative for abdominal pain, nausea and vomiting.  Genitourinary: Negative for dysuria.  Musculoskeletal: Negative for back pain and neck  pain.  Skin: Negative for rash and wound.  Neurological: Positive for headaches. Negative for dizziness, syncope and light-headedness.  Psychiatric/Behavioral: The patient is not nervous/anxious.      Physical Exam Updated Vital Signs BP 136/76   Pulse 73   Temp 98.8 F (37.1 C) (Oral)   Resp 20   SpO2 95%   Physical Exam  Constitutional: She appears well-developed and well-nourished. No distress.  HENT:  Head: Normocephalic and atraumatic.    Mouth/Throat: Oropharynx is clear and moist. No oropharyngeal exudate.  Eyes: Conjunctivae are normal. Pupils are equal, round, and reactive to light. Right eye exhibits  no discharge. Left eye exhibits no discharge. No scleral icterus.  Neck: Normal range of motion. Neck supple. No thyromegaly present.  Cardiovascular: Normal rate, regular rhythm, normal heart sounds and intact distal pulses. Exam reveals no gallop and no friction rub.  No murmur heard. Pulmonary/Chest: Effort normal and breath sounds normal. No stridor. No respiratory distress. She has no wheezes. She has no rales.  Abdominal: Soft. Bowel sounds are normal. She exhibits no distension. There is no tenderness. There is no rebound and no guarding.  Musculoskeletal: She exhibits no edema.  No midline tenderness to the cervical, thoracic, or lumbar spine  Lymphadenopathy:    She has no cervical adenopathy.  Neurological: She is alert. Coordination normal.  CN 3-12 intact; normal sensation throughout; 5/5 strength in bilateral upper extremities and L lower, but 3/5 strength in R leg (baseline); equal bilateral grip strength  Skin: Skin is warm and dry. No rash noted. She is not diaphoretic. No pallor.  Psychiatric: She has a normal mood and affect.  Nursing note and vitals reviewed.    ED Treatments / Results  Labs (all labs ordered are listed, but only abnormal results are displayed) Labs Reviewed - No data to display  EKG  EKG Interpretation None        Radiology Ct Head Wo Contrast  Result Date: 06/30/2017 CLINICAL DATA:  Status post fall, hitting head on concrete. Posterior scalp hematoma. Concern for cervical spine injury. EXAM: CT HEAD WITHOUT CONTRAST CT CERVICAL SPINE WITHOUT CONTRAST TECHNIQUE: Multidetector CT imaging of the head and cervical spine was performed following the standard protocol without intravenous contrast. Multiplanar CT image reconstructions of the cervical spine were also generated. COMPARISON:  None. FINDINGS: CT HEAD FINDINGS Brain: No evidence of acute infarction, hemorrhage, hydrocephalus, extra-axial collection or mass lesion/mass effect. The posterior fossa, including the cerebellum, brainstem and fourth ventricle, is within normal limits. The third and lateral ventricles, and basal ganglia are unremarkable in appearance. The cerebral hemispheres are symmetric in appearance, with normal gray-white differentiation. No mass effect or midline shift is seen. Vascular: No hyperdense vessel or unexpected calcification. Skull: There is no evidence of fracture; visualized osseous structures are unremarkable in appearance. Sinuses/Orbits: The orbits are within normal limits. The paranasal sinuses and mastoid air cells are well-aerated. Other: Mild soft tissue swelling is noted at the occiput. CT CERVICAL SPINE FINDINGS Alignment: There is mild grade 1 anterolisthesis of C3 on C4 and of C4 on C5. Skull base and vertebrae: No acute fracture. No primary bone lesion or focal pathologic process. Soft tissues and spinal canal: No prevertebral fluid or swelling. No visible canal hematoma. Disc levels: Mild intervertebral disc space narrowing is noted along the lower cervical spine, with endplate sclerotic change and scattered anterior and posterior disc osteophyte complexes. Mild degenerative change is noted about the dens. Upper chest: The visualized portions of the lungs are clear. The thyroid gland is unremarkable in appearance. Other:  No additional soft tissue abnormalities are seen. IMPRESSION: 1. No evidence of traumatic intracranial injury or fracture. 2. No evidence of fracture or subluxation along the cervical spine. 3. Mild soft tissue swelling at the occiput. 4. Mild degenerative change along the cervical spine. Electronically Signed   By: Garald Balding M.D.   On: 06/30/2017 23:42   Ct Cervical Spine Wo Contrast  Result Date: 06/30/2017 CLINICAL DATA:  Status post fall, hitting head on concrete. Posterior scalp hematoma. Concern for cervical spine injury. EXAM: CT HEAD WITHOUT CONTRAST CT CERVICAL SPINE WITHOUT CONTRAST TECHNIQUE:  Multidetector CT imaging of the head and cervical spine was performed following the standard protocol without intravenous contrast. Multiplanar CT image reconstructions of the cervical spine were also generated. COMPARISON:  None. FINDINGS: CT HEAD FINDINGS Brain: No evidence of acute infarction, hemorrhage, hydrocephalus, extra-axial collection or mass lesion/mass effect. The posterior fossa, including the cerebellum, brainstem and fourth ventricle, is within normal limits. The third and lateral ventricles, and basal ganglia are unremarkable in appearance. The cerebral hemispheres are symmetric in appearance, with normal gray-white differentiation. No mass effect or midline shift is seen. Vascular: No hyperdense vessel or unexpected calcification. Skull: There is no evidence of fracture; visualized osseous structures are unremarkable in appearance. Sinuses/Orbits: The orbits are within normal limits. The paranasal sinuses and mastoid air cells are well-aerated. Other: Mild soft tissue swelling is noted at the occiput. CT CERVICAL SPINE FINDINGS Alignment: There is mild grade 1 anterolisthesis of C3 on C4 and of C4 on C5. Skull base and vertebrae: No acute fracture. No primary bone lesion or focal pathologic process. Soft tissues and spinal canal: No prevertebral fluid or swelling. No visible canal  hematoma. Disc levels: Mild intervertebral disc space narrowing is noted along the lower cervical spine, with endplate sclerotic change and scattered anterior and posterior disc osteophyte complexes. Mild degenerative change is noted about the dens. Upper chest: The visualized portions of the lungs are clear. The thyroid gland is unremarkable in appearance. Other: No additional soft tissue abnormalities are seen. IMPRESSION: 1. No evidence of traumatic intracranial injury or fracture. 2. No evidence of fracture or subluxation along the cervical spine. 3. Mild soft tissue swelling at the occiput. 4. Mild degenerative change along the cervical spine. Electronically Signed   By: Garald Balding M.D.   On: 06/30/2017 23:42    Procedures Procedures (including critical care time)  Medications Ordered in ED Medications - No data to display   Initial Impression / Assessment and Plan / ED Course  I have reviewed the triage vital signs and the nursing notes.  Pertinent labs & imaging results that were available during my care of the patient were reviewed by me and considered in my medical decision making (see chart for details).     Patient with head injury.  No syncope and normal neuro exam, however considering patient's age and comorbidities, CT head and C-spine were obtained.  CT of the head and C-spine show no evidence of traumatic injury, fracture, fracture or subluxation the lungs spine.  There is mild soft tissue swelling at the occiput and mild degenerative change of the cervical spine.  Will discharge home with supportive treatment including ice for hematoma, Tylenol.  Patient advised to follow-up with PCP in 2-3 days for recheck.  Return precautions discussed.  Patient understands and agrees with plan.  Patient vitals stable and discharged in satisfactory condition.  Patient also evaluated by Dr. Dayna Barker who guided the patient's management and agrees with plan.  Final Clinical Impressions(s) / ED  Diagnoses   Final diagnoses:  Minor head injury, initial encounter  Hematoma of scalp, initial encounter    ED Discharge Orders    None         Caryl Ada 06/30/17 2355    Merrily Pew, MD 07/02/17 [phone]

## 2017-06-30 NOTE — Discharge Instructions (Addendum)
Use ice 3-4 times daily alternating 20 minutes on, 20 minutes off.  You can take Tylenol as prescribed over-the-counter, as needed for headache.  Please follow-up with your doctor in 2-3 days for recheck.  Please return to emergency department if you develop any new or worsening symptoms.

## 2017-06-30 NOTE — Therapy (Signed)
[facility] [address] [place], [place], [address] Phone: [phone]   Fax:  [phone]  Occupational Therapy Treatment  Patient Details  Name: Laurie Wise MRN: [id number] Date of Birth: 1956/03/19 Referring Provider (Historical): Dr. Creig Hines   Encounter Date: 06/29/2017  OT End of Session - 06/29/17 1026    Visit Number  5    Number of Visits  11    Date for OT Re-Evaluation  07/21/17    Authorization Type  Medicare    Authorization Time Period  60 days 06/06/17-08/04/16    Authorization - Visit Number  4    Authorization - Number of Visits  10    OT Start Time  1025 pt late    OT Stop Time  1100    OT Time Calculation (min)  35 min    Activity Tolerance  Patient tolerated treatment well    Behavior During Therapy  [facility] for tasks assessed/performed       Past Medical History:  Diagnosis Date  . Ductal carcinoma in situ (DCIS) of left breast  [facility] in [place]--- per pt no recurrence   dx s/p  left breast lumpectomy and radiation  . History of acute pyelonephritis 03/06/2017   w/ sepsis due to ureteral stone obstruction  . History of radiation therapy 2016   left breast   . Left ureteral stone   . Leta [facility] disease [facility])   . Nephrolithiasis    bilateral non-obstructive per CT 03-06-2017  . OSA on CPAP    setting 8  . Sensation of pressure in bladder area   . Tachycardia    taking beta blocker  . Uses walker   . Wears glasses     Past Surgical History:  Procedure Laterality Date  . BREAST LUMPECTOMY Left 2016   [place] in [place]   DCIS-lumpectomy  . CESAREAN SECTION  1990  . CYSTOSCOPY WITH STENT PLACEMENT Right 03/06/2017   Procedure: CYSTOSCOPY WITH RIGHT STENT PLACEMENT;  Surgeon: Franchot Gallo, MD;  Location: [facility];  Service: Urology;  Laterality: Right;  . CYSTOSCOPY/URETEROSCOPY/HOLMIUM LASER/STENT PLACEMENT Right 04/03/2017   Procedure:  CYSTOSCOPY/URETEROSCOPY/HOLMIUM LASER/STONE EXTRACTION/STENT REPLACEMENT;  Surgeon: Franchot Gallo, MD;  Location: [facility]  Service: Urology;  Laterality: Right;  . LAMINECTOMY  2003   L4-5  . PORTA CATH INSERTION  08/2016   power port for ALS IV medication    There were no vitals filed for this visit.  Subjective Assessment - 06/29/17 1026    Pertinent History  ALS    Patient Stated Goals  to maintain arm strength    Currently in Pain?  No/denies                           OT Education - 06/30/17 1631    Education provided  Yes    Education Details  Progress towards goals, updated putty HEP, adapted strategies for ADLS/AE    Person(s) Educated  Patient    Methods  Explanation;Demonstration;Verbal cues    Comprehension  Verbalized understanding;Returned demonstration          OT Long Term Goals - 06/29/17 1030      OT LONG TERM GOAL #1   Title  I with HEP- due 07/21/16    Status  Achieved      OT LONG TERM GOAL #2   Title  Pt will verbalize understanding of activity modification, DME and  AE to increase safety and independence with ADLS/IADLS.    Status  Achieved      OT LONG TERM GOAL #3   Title  Pt will verbalize understanding of energy conservation techniques    Status  Achieved            Plan - 06/30/17 1630    Clinical Impression Statement  Pt verbalized understanding of AE/strategies for ADLs, HEP and pt reports she is ready for d/c.    Rehab Potential  Good    OT Frequency  2x / week    OT Duration  -- 5 eval    OT Treatment/Interventions  Self-care/ADL training;Moist Heat;Fluidtherapy;DME and/or AE instruction;Splinting;Balance training;Therapeutic activities;Therapeutic exercise;Passive range of motion;Functional Mobility Training;Neuromuscular education;Electrical Stimulation;Paraffin;Energy conservation;Patient/family education    Plan  d/c OT       Patient will benefit from skilled therapeutic intervention  in order to improve the following deficits and impairments:  Abnormal gait, Decreased knowledge of use of DME, Decreased mobility, Decreased activity tolerance, Decreased endurance, Decreased strength, Impaired UE functional use, Impaired perceived functional ability, Difficulty walking, Decreased safety awareness, Decreased knowledge of precautions, Decreased balance  Visit Diagnosis: Muscle weakness (generalized)  Other symptoms and signs involving the nervous system   OCCUPATIONAL THERAPY DISCHARGE SUMMARY    Current functional level related to goals / functional outcomes: Pt met all goals.   Remaining deficits: Mild weakness, decreased balance   Education / Equipment: Pt was educated regarding HEP, adapted strategies for ADLs and AE. Pt verbalizes understanding Plan: Patient agrees to discharge.  Patient goals were met. Patient is being discharged due to meeting the stated rehab goals.  ?????     Problem List Patient Active Problem List   Diagnosis Date Noted  . UTI (urinary tract infection) 03/21/2017  . Pyonephrosis 03/06/2017  . Sepsis ([place]) 03/06/2017  . ALS (amyotrophic lateral sclerosis) ([place]) 03/06/2017    RINE,KATHRYN 06/30/2017, 4:33 PM Theone Murdoch, OTR/L [phone] Phone: [phone] 4:33 PM 12/14/18[facility] [address] [place], [place], [address] Phone: [phone]   Fax:  [phone]  Name: Marget Outten MRN: [id number] Date of Birth: [birth year]

## 2017-07-04 ENCOUNTER — Encounter: Payer: Medicare Other | Admitting: Occupational Therapy

## 2017-07-04 ENCOUNTER — Ambulatory Visit: Payer: BLUE CROSS/BLUE SHIELD | Admitting: Physical Therapy

## 2017-07-04 DIAGNOSIS — N200 Calculus of kidney: Secondary | ICD-10-CM | POA: Diagnosis not present

## 2017-07-06 ENCOUNTER — Encounter: Payer: Medicare Other | Admitting: Occupational Therapy

## 2017-07-06 ENCOUNTER — Ambulatory Visit: Payer: BLUE CROSS/BLUE SHIELD | Admitting: Rehabilitative and Restorative Service Providers"

## 2017-07-06 ENCOUNTER — Encounter: Payer: Self-pay | Admitting: Rehabilitative and Restorative Service Providers"

## 2017-07-06 ENCOUNTER — Ambulatory Visit: Payer: Medicare Other | Admitting: Physical Therapy

## 2017-07-06 DIAGNOSIS — M6281 Muscle weakness (generalized): Secondary | ICD-10-CM

## 2017-07-06 DIAGNOSIS — R29818 Other symptoms and signs involving the nervous system: Secondary | ICD-10-CM

## 2017-07-06 DIAGNOSIS — R2689 Other abnormalities of gait and mobility: Secondary | ICD-10-CM

## 2017-07-06 NOTE — Therapy (Signed)
[facility] [address] [place], [place], [address] Phone: [phone]   Fax:  [phone]  Physical Therapy Treatment  Patient Details  Name: Laurie Wise MRN: [id number] Date of Birth: [birth year] Referring Provider: Rolena Infante, MD   Encounter Date: 07/06/2017  PT End of Session - 07/06/17 1532    Visit Number  13    Number of Visits  17    Date for PT Re-Evaluation  07/04/17    Authorization Type  private insurance    PT Start Time  1030    PT Stop Time  1100    PT Time Calculation (min)  30 min    Equipment Utilized During Treatment  Gait belt    Activity Tolerance  Patient tolerated treatment well    Behavior During Therapy  [facility] for tasks assessed/performed       Past Medical History:  Diagnosis Date  . Ductal carcinoma in situ (DCIS) of left breast  [facility] in [place]--- per pt no recurrence   dx s/p  left breast lumpectomy and radiation  . History of acute pyelonephritis 03/06/2017   w/ sepsis due to ureteral stone obstruction  . History of radiation therapy 2016   left breast   . Left ureteral stone   . Leta [facility] disease [facility])   . Nephrolithiasis    bilateral non-obstructive per CT 03-06-2017  . OSA on CPAP    setting 8  . Sensation of pressure in bladder area   . Tachycardia    taking beta blocker  . Uses walker   . Wears glasses     Past Surgical History:  Procedure Laterality Date  . BREAST LUMPECTOMY Left 2016   [place] in [place]   DCIS-lumpectomy  . CESAREAN SECTION  1990  . CYSTOSCOPY WITH STENT PLACEMENT Right 03/06/2017   Procedure: CYSTOSCOPY WITH RIGHT STENT PLACEMENT;  Surgeon: Franchot Gallo, MD;  Location: [facility];  Service: Urology;  Laterality: Right;  . CYSTOSCOPY/URETEROSCOPY/HOLMIUM LASER/STENT PLACEMENT Right 04/03/2017   Procedure: CYSTOSCOPY/URETEROSCOPY/HOLMIUM LASER/STONE EXTRACTION/STENT REPLACEMENT;  Surgeon: Franchot Gallo, MD;  Location: [facility];  Service: Urology;  Laterality: Right;  . LAMINECTOMY  2003   L4-5  . PORTA CATH INSERTION  08/2016   power port for ALS IV medication    There were no vitals filed for this visit.  Subjective Assessment - 07/06/17 1031    Subjective  The patient fell at [facility] and landed on the posterior aspect of her head.   She reports no current HA or pain.  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    Patient Stated Goals  "Overall strength, and in that process learn how to get up off the floor."  Also recently obtained a separate knee brace after trying KAFO *wants to look at options.    Currently in Pain?  No/denies                      [facility] Adult PT Treatment/Exercise - 07/06/17 1533      Ambulation/Gait   Ambulation/Gait  Yes    Ambulation/Gait Assistance  6: Modified independent (Device/Increase time)    Ambulation Distance (Feet)  230 Feet x 3 reps    Assistive device  Straight cane bilateral    Ambulation Surface  Level;Indoor    Gait Comments  ORTHOTICS AND PROSTHETICS CHECKOUT:  Patient brought in her ordered swedish knee cage and PT and patient worked on adjusting posterior strap (initially had on loosest setting -- first screw hold one med/lateral) and progressively tightened posterior strap to third lateral hole.  Used tightest thigh strap setting and distal knee strap setting for support.  Patient tolerated well.                PT Short Term Goals - 06/12/17 1518      PT SHORT TERM GOAL #1   Title  The patient will return demo HEP for flexibility and maintenance of strength.    Status  Achieved      PT SHORT TERM GOAL #2   Title  The patient will be further assessed on bed mobility and LTG to follow (currently notes crawling on hands/knees and "flopping" over).    Baseline  *patient is able to demo bed  mobility with mod indep in clinic for sit<>supine.  Her bed at home is elevated and she is unable to get into/out of by sitting on edge.     Status  Deferred      PT SHORT TERM GOAL #3   Title  The patient will improve gait speed from 2.05 ft/sec to 2.5 ft/sec utilizing more supportive assitive device and R AFO/knee brace to improve stability.     Baseline  13.27 secs with use of 2 canes and AFO on RLE (no knee brace used on RLE) = 2.47 = 2.5 ft/sec  (06-06-17)    Status  Partially Met      PT SHORT TERM GOAL #4   Title  The patient will move floor<>stand with UE support and min A    Status  Achieved        PT Long Term Goals - 06/29/17 1514      PT LONG TERM GOAL #1   Title  The patient will verbalize understanding of community welllness for post d/c.    Time  8    Period  Weeks      PT LONG TERM GOAL #2   Title  The patient will have R LE bracing needs met to improve energy efficiency and safety with gait activities.    Baseline  Plan to order a R swedish knee cage.     Time  8    Period  Weeks    Status  Achieved      PT LONG TERM GOAL #3   Title  The patient will ambulate x 800 ft with RW (if willing to consider for longer community outings) mod indep to improve community access.    Time  8    Period  Weeks      PT LONG TERM GOAL #4   Title  The patient will move floor<>stand with UE support modified indep.    Baseline  Met on 06/22/17.    Time  8    Period  Weeks    Status  Achieved      PT LONG TERM GOAL #5   Title  Bed mobility goal to follow.    Baseline  see STGs.    Time  8    Period  Weeks    Status  Deferred            Plan - 07/06/17 1536    Clinical Impression Statement  The patient's swedish knee cage was in and she picked up en route to therapy.  We adjusted brace to find most comfortable fit.  She has some pressure point noted on medial aspect of knee.  Recommended she begin wearing in 1 hour timeframes to avoid excessive pressure/bruising.     PT Treatment/Interventions  ADLs/Self Care Home Management;Functional mobility training;Gait training;Therapeutic activities;Therapeutic exercise;Balance training;Neuromuscular re-education;Patient/family education;Orthotic Fit/Training;Manual techniques    PT Next Visit Plan  Work to LTGs, review HEP, LE strengthening, obtain swedish knee cage, d/c planning    Consulted and Agree with Plan of Care  Patient       Patient will benefit from skilled therapeutic intervention in order to improve the following deficits and impairments:  Abnormal gait, Decreased balance, Decreased activity tolerance, Decreased strength, Difficulty walking, Impaired flexibility  Visit Diagnosis: Muscle weakness (generalized)  Other symptoms and signs involving the nervous system  Other abnormalities of gait and mobility     Problem List Patient Active Problem List   Diagnosis Date Noted  . UTI (urinary tract infection) 03/21/2017  . Pyonephrosis 03/06/2017  . Sepsis ([place]) 03/06/2017  . ALS (amyotrophic lateral sclerosis) ([place]) 03/06/2017    Decarla Siemen, PT 07/06/2017, 3:37 PM  [facility] [address] [place], [place], [address] Phone: [phone]   Fax:  [phone]  Name: Laurie Wise MRN: [id number] Date of Birth: 1956/07/07

## 2017-07-13 ENCOUNTER — Ambulatory Visit: Payer: Medicare Other | Admitting: Physical Therapy

## 2017-07-13 ENCOUNTER — Ambulatory Visit: Payer: Medicare Other | Admitting: Rehabilitative and Restorative Service Providers"

## 2017-07-13 ENCOUNTER — Encounter: Payer: Medicare Other | Admitting: Occupational Therapy

## 2017-08-18 DIAGNOSIS — N200 Calculus of kidney: Secondary | ICD-10-CM | POA: Diagnosis not present

## 2017-08-23 DIAGNOSIS — R0982 Postnasal drip: Secondary | ICD-10-CM | POA: Diagnosis not present

## 2017-08-23 DIAGNOSIS — K219 Gastro-esophageal reflux disease without esophagitis: Secondary | ICD-10-CM | POA: Diagnosis not present

## 2017-10-03 DIAGNOSIS — N200 Calculus of kidney: Secondary | ICD-10-CM | POA: Diagnosis not present

## 2017-10-06 DIAGNOSIS — Z79899 Other long term (current) drug therapy: Secondary | ICD-10-CM | POA: Diagnosis not present

## 2017-10-06 DIAGNOSIS — Z5111 Encounter for antineoplastic chemotherapy: Secondary | ICD-10-CM | POA: Diagnosis not present

## 2017-10-06 DIAGNOSIS — G1221 Amyotrophic lateral sclerosis: Secondary | ICD-10-CM | POA: Diagnosis not present

## 2017-10-06 DIAGNOSIS — Z95828 Presence of other vascular implants and grafts: Secondary | ICD-10-CM | POA: Diagnosis not present

## 2017-10-06 DIAGNOSIS — Z9189 Other specified personal risk factors, not elsewhere classified: Secondary | ICD-10-CM | POA: Diagnosis not present

## 2017-10-06 DIAGNOSIS — Z9911 Dependence on respirator [ventilator] status: Secondary | ICD-10-CM | POA: Diagnosis not present

## 2017-10-06 DIAGNOSIS — M6281 Muscle weakness (generalized): Secondary | ICD-10-CM | POA: Diagnosis not present

## 2017-10-12 ENCOUNTER — Encounter: Payer: Self-pay | Admitting: Rehabilitative and Restorative Service Providers"

## 2017-10-12 NOTE — Therapy (Signed)
[facility] [address] [place], [place], [address] Phone: [phone]   Fax:  [phone]  Patient Details  Name: Laurie Wise MRN: [id number] Date of Birth: 1956/01/21  Encounter Date: last encounter 09/06/16  PHYSICAL THERAPY DISCHARGE SUMMARY  Visits from Start of Care: 13  Current functional level related to goals / functional outcomes: PT Short Term Goals - 06/12/17 1518      PT SHORT TERM GOAL #1   Title  The patient will return demo HEP for flexibility and maintenance of strength.    Status  Achieved      PT SHORT TERM GOAL #2   Title  The patient will be further assessed on bed mobility and LTG to follow (currently notes crawling on hands/knees and "flopping" over).    Baseline  *patient is able to demo bed mobility with mod indep in clinic for sit<>supine.  Her bed at home is elevated and she is unable to get into/out of by sitting on edge.     Status  Deferred      PT SHORT TERM GOAL #3   Title  The patient will improve gait speed from 2.05 ft/sec to 2.5 ft/sec utilizing more supportive assitive device and R AFO/knee brace to improve stability.     Baseline  13.27 secs with use of 2 canes and AFO on RLE (no knee brace used on RLE) = 2.47 = 2.5 ft/sec  (06-06-17)    Status  Partially Met      PT SHORT TERM GOAL #4   Title  The patient will move floor<>stand with UE support and min A    Status  Achieved      PT Long Term Goals - 06/29/17 1514      PT LONG TERM GOAL #1   Title  The patient will verbalize understanding of community welllness for post d/c.    Time  8    Period  Weeks      PT LONG TERM GOAL #2   Title  The patient will have R LE bracing needs met to improve energy efficiency and safety with gait activities.    Baseline  Plan to order a R swedish knee cage.     Time  8    Period  Weeks    Status  Achieved      PT LONG TERM GOAL #3   Title  The patient will ambulate x 800 ft with  RW (if willing to consider for longer community outings) mod indep to improve community access.    Time  8    Period  Weeks      PT LONG TERM GOAL #4   Title  The patient will move floor<>stand with UE support modified indep.    Baseline  Met on 06/22/17.    Time  8    Period  Weeks    Status  Achieved      PT LONG TERM GOAL #5   Title  Bed mobility goal to follow.    Baseline  see STGs.    Time  8    Period  Weeks    Status  Deferred         Remaining deficits: Due to nature of illness, patient continues with weakness associated with ALS.   Education / Equipment: Home program, home safety.  Plan: Patient agrees to discharge.  Patient goals were partially met. Patient is being discharged due to meeting the stated rehab goals.  ?????  Thank you for the referral of this patient. Rudell Cobb, MPT   Laurie Wise 10/12/2017, 1:49 PM  [facility] [address] [place], [place], [address] Phone: [phone]   Fax:  [phone]

## 2018-01-05 DIAGNOSIS — Z5111 Encounter for antineoplastic chemotherapy: Secondary | ICD-10-CM | POA: Diagnosis not present

## 2018-01-05 DIAGNOSIS — Z9189 Other specified personal risk factors, not elsewhere classified: Secondary | ICD-10-CM | POA: Diagnosis not present

## 2018-01-05 DIAGNOSIS — G1221 Amyotrophic lateral sclerosis: Secondary | ICD-10-CM | POA: Diagnosis not present

## 2018-01-05 DIAGNOSIS — J449 Chronic obstructive pulmonary disease, unspecified: Secondary | ICD-10-CM | POA: Diagnosis not present

## 2018-01-05 DIAGNOSIS — Z9911 Dependence on respirator [ventilator] status: Secondary | ICD-10-CM | POA: Diagnosis not present

## 2018-01-05 DIAGNOSIS — Z79899 Other long term (current) drug therapy: Secondary | ICD-10-CM | POA: Diagnosis not present

## 2018-01-05 DIAGNOSIS — Z95828 Presence of other vascular implants and grafts: Secondary | ICD-10-CM | POA: Diagnosis not present

## 2018-01-24 DIAGNOSIS — R399 Unspecified symptoms and signs involving the genitourinary system: Secondary | ICD-10-CM | POA: Diagnosis not present

## 2018-02-14 IMAGING — CT CT HEAD W/O CM
5 of 7 series · 17 of 47 positions shown, 18 images · non-contrast
Comparison: None.

CLINICAL DATA: Status post fall, hitting head on concrete.
Posterior scalp hematoma. Concern for cervical spine injury.

EXAM:
CT HEAD WITHOUT CONTRAST
CT CERVICAL SPINE WITHOUT CONTRAST
TECHNIQUE: Multidetector CT imaging of the head and cervical spine was
performed following the standard protocol without intravenous
contrast. Multiplanar CT image reconstructions of the cervical spine
were also generated.

[Series 3: head wo · axial · 0.47mm/px · z∈[-58,+32]mm · 3 of 36 slices shown, 4 images]
[im 9/36  brain]
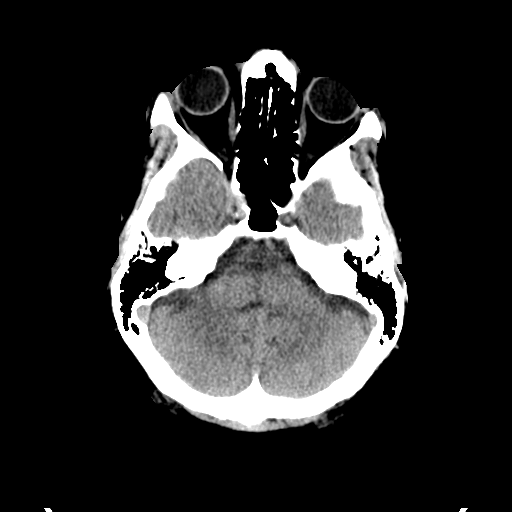
[im 9/36  bone]
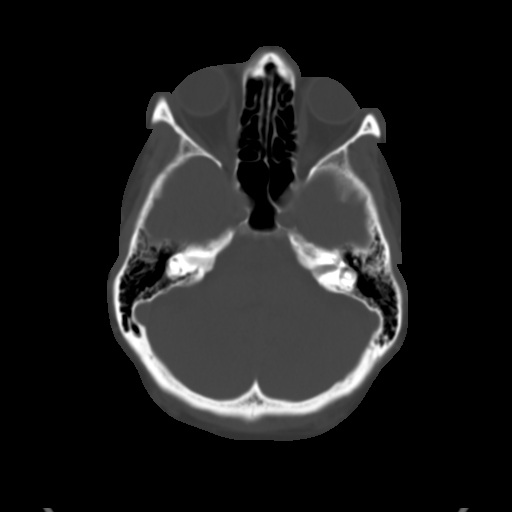
[im 18/36  brain]
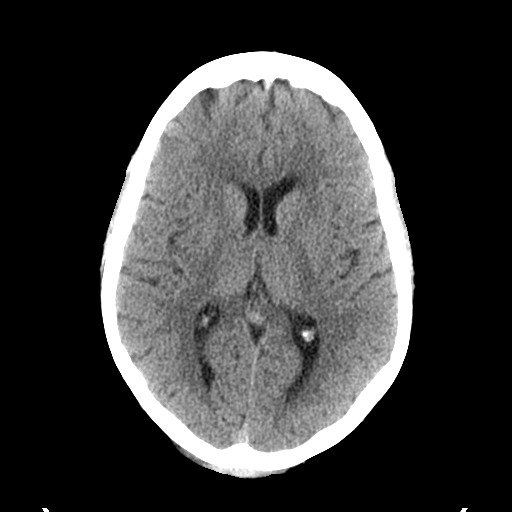
[im 27/36  brain]
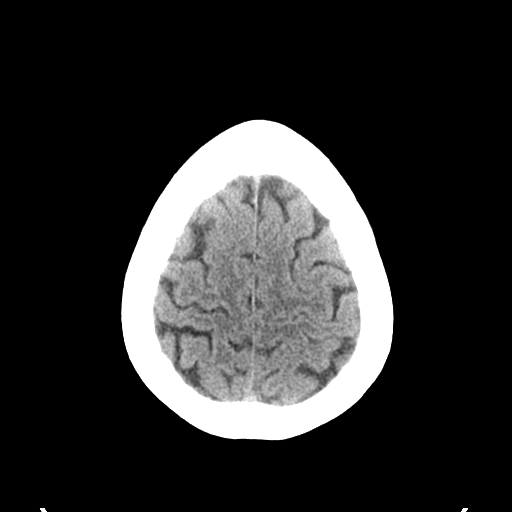

[Series 6: c spine soft · axial · 0.33mm/px · z∈[-266,-216]mm · 3 of 94 slices shown]
[im 9/94  brain]
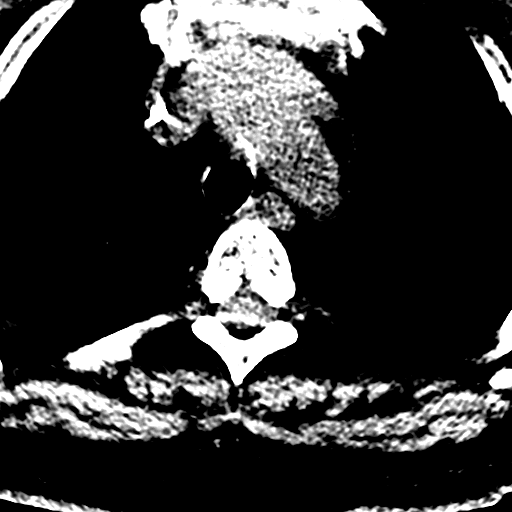
[im 17/94  brain]
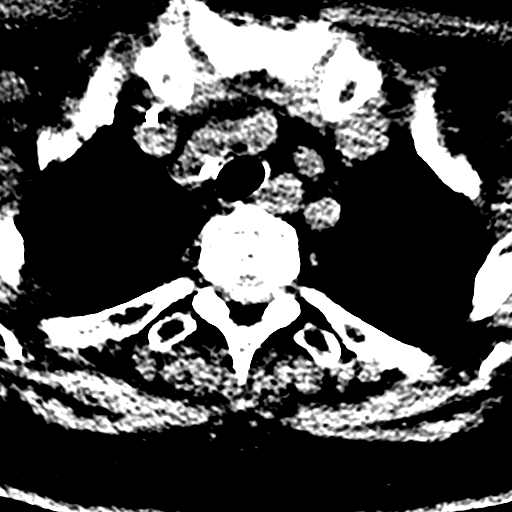
[im 34/94  brain]
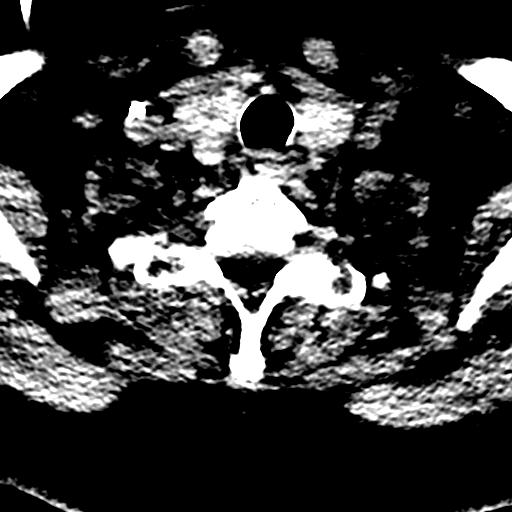

[Series 7: coronal soft tissue · coronal · 0.33mm/px · 2 of 74 slices shown]
[im 14/74  brain]
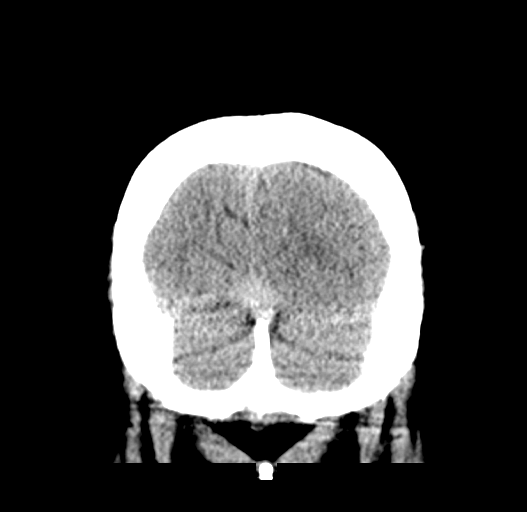
[im 28/74  brain]
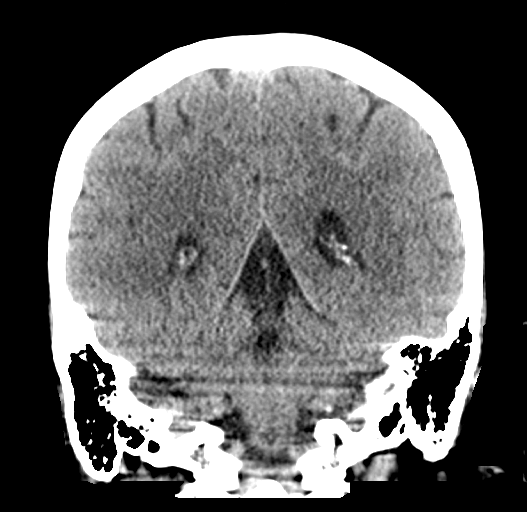

[Series 8: sagittal soft tissue · sagittal · 0.33mm/px · 1 of 59 slices shown]
[im 30/59  brain]
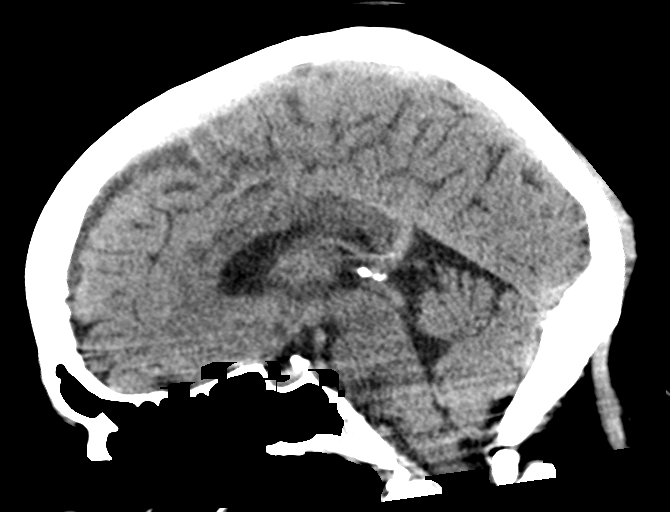

[Series 9: orthogonal bone · axial · 0.23mm/px · z∈[-284,-135]mm · 8 of 98 slices shown]
[im 9/98  bone]
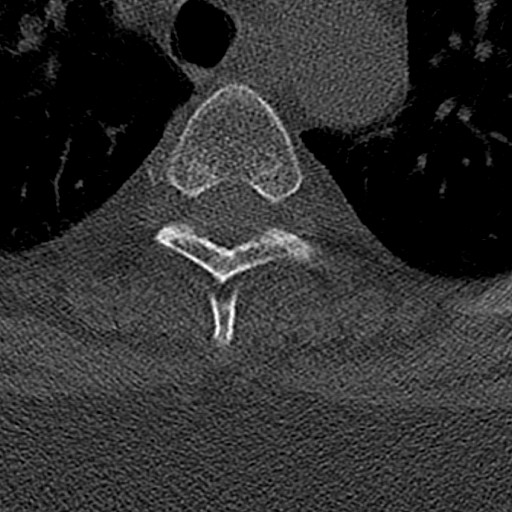
[im 25/98  bone]
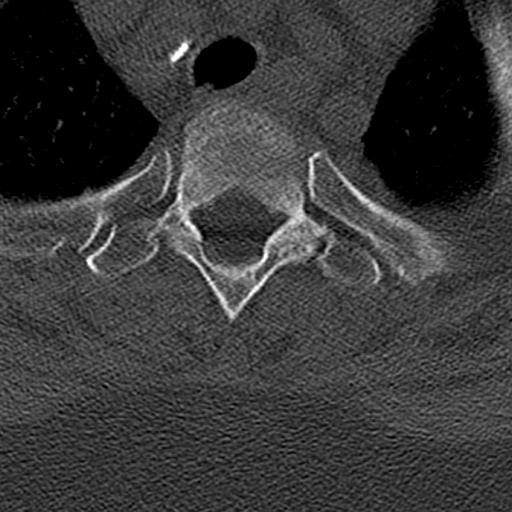
[im 33/98  bone]
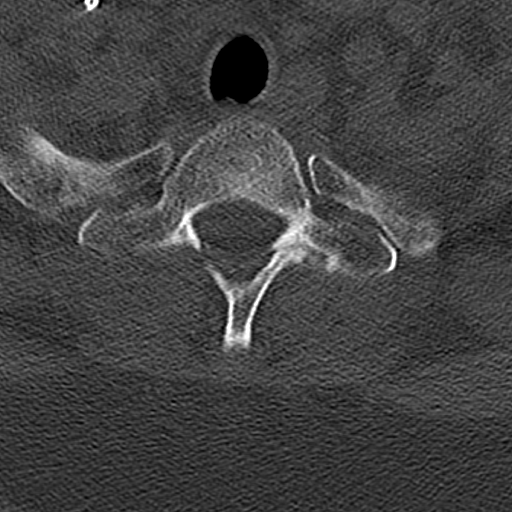
[im 41/98  bone]
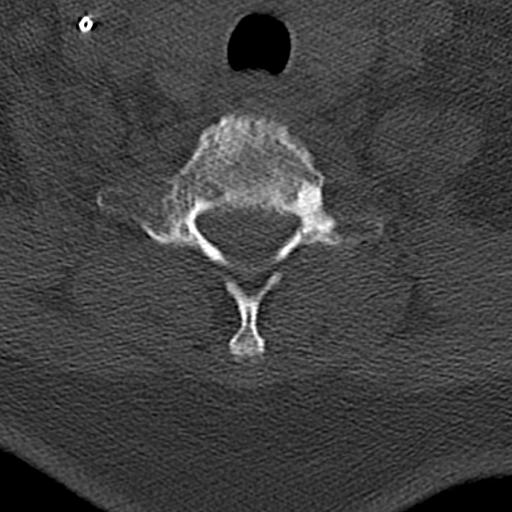
[im 57/98  bone]
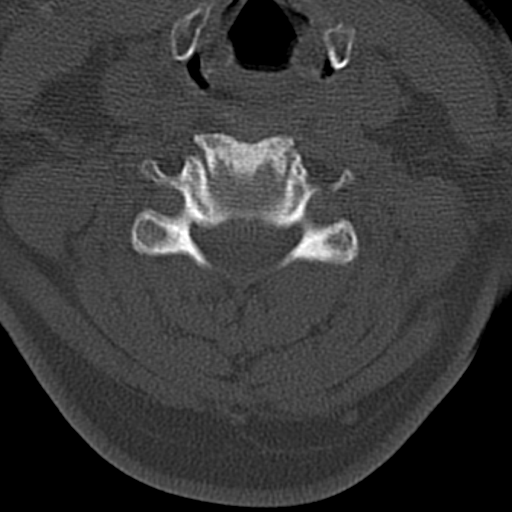
[im 65/98  bone]
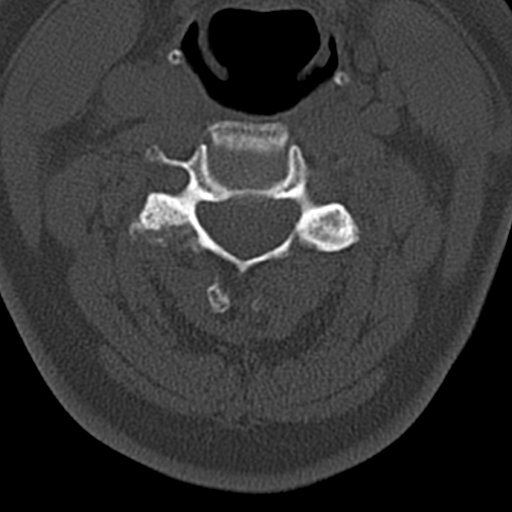
[im 73/98  bone]
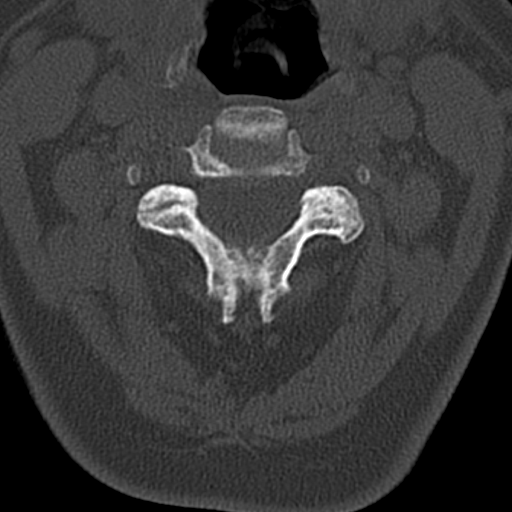
[im 89/98  bone]
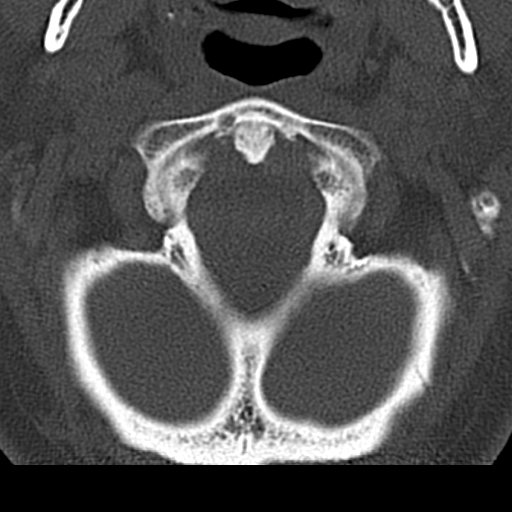

[17 of 47 positions shown; findings below may reference images not displayed]

FINDINGS: CT HEAD FINDINGS

Brain: No evidence of acute infarction, hemorrhage, hydrocephalus,
extra-axial collection or mass lesion/mass effect.

The posterior fossa, including the cerebellum, brainstem and fourth
ventricle, is within normal limits. The third and lateral
ventricles, and basal ganglia are unremarkable in appearance. The
cerebral hemispheres are symmetric in appearance, with normal
gray-white differentiation. No mass effect or midline shift is seen.

Vascular: No hyperdense vessel or unexpected calcification.

Skull: There is no evidence of fracture; visualized osseous
structures are unremarkable in appearance.

Sinuses/Orbits: The orbits are within normal limits. The paranasal
sinuses and mastoid air cells are well-aerated.

Other: Mild soft tissue swelling is noted at the occiput.

CT CERVICAL SPINE FINDINGS

Alignment: There is mild grade 1 anterolisthesis of C3 on C4 and of
C4 on C5.

Skull base and vertebrae: No acute fracture. No primary bone lesion
or focal pathologic process.

Soft tissues and spinal canal: No prevertebral fluid or swelling. No
visible canal hematoma.

Disc levels: Mild intervertebral disc space narrowing is noted along
the lower cervical spine, with endplate sclerotic change and
scattered anterior and posterior disc osteophyte complexes. Mild
degenerative change is noted about the dens.

Upper chest: The visualized portions of the lungs are clear. The
thyroid gland is unremarkable in appearance.

Other: No additional soft tissue abnormalities are seen.
IMPRESSION: 1. No evidence of traumatic intracranial injury or fracture.
2. No evidence of fracture or subluxation along the cervical spine.
3. Mild soft tissue swelling at the occiput.
4. Mild degenerative change along the cervical spine.

## 2018-03-12 DIAGNOSIS — S0003XA Contusion of scalp, initial encounter: Secondary | ICD-10-CM | POA: Diagnosis not present

## 2018-03-12 DIAGNOSIS — S0990XA Unspecified injury of head, initial encounter: Secondary | ICD-10-CM | POA: Diagnosis not present

## 2018-08-14 DIAGNOSIS — Z95828 Presence of other vascular implants and grafts: Secondary | ICD-10-CM | POA: Diagnosis not present

## 2018-08-14 DIAGNOSIS — Z9189 Other specified personal risk factors, not elsewhere classified: Secondary | ICD-10-CM | POA: Diagnosis not present

## 2018-08-14 DIAGNOSIS — Z5111 Encounter for antineoplastic chemotherapy: Secondary | ICD-10-CM | POA: Diagnosis not present

## 2018-08-14 DIAGNOSIS — Z9911 Dependence on respirator [ventilator] status: Secondary | ICD-10-CM | POA: Diagnosis not present

## 2018-08-14 DIAGNOSIS — G1221 Amyotrophic lateral sclerosis: Secondary | ICD-10-CM | POA: Diagnosis not present

## 2018-08-14 DIAGNOSIS — J449 Chronic obstructive pulmonary disease, unspecified: Secondary | ICD-10-CM | POA: Diagnosis not present

## 2018-08-14 DIAGNOSIS — J9610 Chronic respiratory failure, unspecified whether with hypoxia or hypercapnia: Secondary | ICD-10-CM | POA: Diagnosis not present

## 2018-12-17 DIAGNOSIS — G1221 Amyotrophic lateral sclerosis: Secondary | ICD-10-CM | POA: Diagnosis not present

## 2018-12-18 DIAGNOSIS — L304 Erythema intertrigo: Secondary | ICD-10-CM | POA: Diagnosis not present

## 2018-12-24 DIAGNOSIS — L304 Erythema intertrigo: Secondary | ICD-10-CM | POA: Diagnosis not present

## 2019-01-14 DIAGNOSIS — G1221 Amyotrophic lateral sclerosis: Secondary | ICD-10-CM | POA: Diagnosis not present

## 2019-01-14 DIAGNOSIS — D0512 Intraductal carcinoma in situ of left breast: Secondary | ICD-10-CM | POA: Diagnosis not present

## 2019-01-21 DIAGNOSIS — D0512 Intraductal carcinoma in situ of left breast: Secondary | ICD-10-CM | POA: Diagnosis not present

## 2019-01-21 DIAGNOSIS — G1221 Amyotrophic lateral sclerosis: Secondary | ICD-10-CM | POA: Diagnosis not present

## 2019-03-27 DIAGNOSIS — D0512 Intraductal carcinoma in situ of left breast: Secondary | ICD-10-CM | POA: Diagnosis not present

## 2019-04-09 DIAGNOSIS — G1221 Amyotrophic lateral sclerosis: Secondary | ICD-10-CM | POA: Diagnosis not present

## 2019-04-19 DIAGNOSIS — G1221 Amyotrophic lateral sclerosis: Secondary | ICD-10-CM | POA: Diagnosis not present

## 2019-04-26 DIAGNOSIS — G1221 Amyotrophic lateral sclerosis: Secondary | ICD-10-CM | POA: Diagnosis not present

## 2019-05-17 DIAGNOSIS — G1221 Amyotrophic lateral sclerosis: Secondary | ICD-10-CM | POA: Diagnosis not present

## 2019-05-24 DIAGNOSIS — G1221 Amyotrophic lateral sclerosis: Secondary | ICD-10-CM | POA: Diagnosis not present

## 2019-06-12 DIAGNOSIS — G1221 Amyotrophic lateral sclerosis: Secondary | ICD-10-CM | POA: Diagnosis not present

## 2019-06-19 DIAGNOSIS — G1221 Amyotrophic lateral sclerosis: Secondary | ICD-10-CM | POA: Diagnosis not present

## 2019-06-24 DIAGNOSIS — N200 Calculus of kidney: Secondary | ICD-10-CM | POA: Diagnosis not present
